# Patient Record
Sex: Female | Born: 1990 | Race: White | Hispanic: No | Marital: Single | Smoking: Former smoker
Health system: Southern US, Community
[De-identification: ages and names within clinical notes are randomized; demographics above are authoritative.]

## PROBLEM LIST (undated history)

## (undated) DIAGNOSIS — K2100 Gastro-esophageal reflux disease with esophagitis, without bleeding: Secondary | ICD-10-CM

## (undated) DIAGNOSIS — T7840XA Allergy, unspecified, initial encounter: Secondary | ICD-10-CM

## (undated) DIAGNOSIS — A599 Trichomoniasis, unspecified: Secondary | ICD-10-CM

## (undated) HISTORY — DX: Allergy, unspecified, initial encounter: T78.40XA

## (undated) HISTORY — PX: NO PAST SURGERIES: SHX2092

## (undated) HISTORY — DX: Gastro-esophageal reflux disease with esophagitis, without bleeding: K21.00

---
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
## 2010-01-14 ENCOUNTER — Inpatient Hospital Stay (HOSPITAL_COMMUNITY): Admission: AD | Admit: 2010-01-14 | Discharge: 2010-01-14 | Payer: Self-pay | Admitting: Obstetrics & Gynecology

## 2010-03-11 ENCOUNTER — Inpatient Hospital Stay (HOSPITAL_COMMUNITY): Admission: AD | Admit: 2010-03-11 | Discharge: 2010-03-11 | Payer: Self-pay | Admitting: Obstetrics and Gynecology

## 2010-03-14 ENCOUNTER — Emergency Department (HOSPITAL_COMMUNITY): Admission: EM | Admit: 2010-03-14 | Discharge: 2010-03-14 | Payer: Self-pay | Admitting: Emergency Medicine

## 2010-05-28 ENCOUNTER — Ambulatory Visit: Payer: Self-pay | Admitting: Gynecology

## 2010-05-28 ENCOUNTER — Inpatient Hospital Stay (HOSPITAL_COMMUNITY): Admission: AD | Admit: 2010-05-28 | Discharge: 2010-05-28 | Payer: Self-pay | Admitting: Family Medicine

## 2010-06-15 ENCOUNTER — Ambulatory Visit: Payer: Self-pay | Admitting: Gynecology

## 2010-06-15 ENCOUNTER — Inpatient Hospital Stay (HOSPITAL_COMMUNITY): Admission: AD | Admit: 2010-06-15 | Discharge: 2010-06-15 | Payer: Self-pay | Admitting: Obstetrics & Gynecology

## 2010-06-17 ENCOUNTER — Inpatient Hospital Stay (HOSPITAL_COMMUNITY): Admission: AD | Admit: 2010-06-17 | Discharge: 2010-06-18 | Payer: Self-pay | Admitting: Obstetrics and Gynecology

## 2010-07-03 ENCOUNTER — Ambulatory Visit: Payer: Self-pay | Admitting: Gynecology

## 2010-07-03 ENCOUNTER — Inpatient Hospital Stay (HOSPITAL_COMMUNITY): Admission: AD | Admit: 2010-07-03 | Discharge: 2010-07-03 | Payer: Self-pay | Admitting: Obstetrics and Gynecology

## 2010-07-03 DIAGNOSIS — O368190 Decreased fetal movements, unspecified trimester, not applicable or unspecified: Secondary | ICD-10-CM

## 2010-07-15 ENCOUNTER — Inpatient Hospital Stay (HOSPITAL_COMMUNITY): Admission: AD | Admit: 2010-07-15 | Discharge: 2010-07-15 | Payer: Self-pay | Admitting: Obstetrics and Gynecology

## 2010-08-06 ENCOUNTER — Inpatient Hospital Stay (HOSPITAL_COMMUNITY): Admission: AD | Admit: 2010-08-06 | Discharge: 2010-08-07 | Payer: Self-pay | Admitting: Obstetrics and Gynecology

## 2010-08-10 ENCOUNTER — Inpatient Hospital Stay (HOSPITAL_COMMUNITY): Admission: AD | Admit: 2010-08-10 | Discharge: 2010-08-10 | Payer: Self-pay | Admitting: Obstetrics and Gynecology

## 2010-08-31 ENCOUNTER — Inpatient Hospital Stay (HOSPITAL_COMMUNITY): Admission: RE | Admit: 2010-08-31 | Discharge: 2010-09-02 | Payer: Self-pay | Admitting: Obstetrics and Gynecology

## 2011-03-04 LAB — CBC
MCV: 86.7 fL (ref 78.0–100.0)
MCV: 87.5 fL (ref 78.0–100.0)
Platelets: 135 10*3/uL — ABNORMAL LOW (ref 150–400)
RBC: 3.01 MIL/uL — ABNORMAL LOW (ref 3.87–5.11)
WBC: 14.3 10*3/uL — ABNORMAL HIGH (ref 4.0–10.5)
WBC: 14.8 10*3/uL — ABNORMAL HIGH (ref 4.0–10.5)

## 2011-03-04 LAB — RPR: RPR Ser Ql: NONREACTIVE

## 2011-03-06 LAB — GC/CHLAMYDIA PROBE AMP, GENITAL: GC Probe Amp, Genital: NEGATIVE

## 2011-03-06 LAB — WET PREP, GENITAL: Trich, Wet Prep: NONE SEEN

## 2011-03-07 LAB — WET PREP, GENITAL: Yeast Wet Prep HPF POC: NONE SEEN

## 2011-03-07 LAB — URINE MICROSCOPIC-ADD ON

## 2011-03-07 LAB — URINALYSIS, ROUTINE W REFLEX MICROSCOPIC
Bilirubin Urine: NEGATIVE
Glucose, UA: NEGATIVE mg/dL
Hgb urine dipstick: NEGATIVE
Ketones, ur: NEGATIVE mg/dL
Nitrite: NEGATIVE
Protein, ur: NEGATIVE mg/dL
Urobilinogen, UA: 0.2 mg/dL (ref 0.0–1.0)
Urobilinogen, UA: 0.2 mg/dL (ref 0.0–1.0)
pH: 6 (ref 5.0–8.0)

## 2011-03-07 LAB — CBC
Hemoglobin: 12.7 g/dL (ref 12.0–15.0)
RBC: 4.29 MIL/uL (ref 3.87–5.11)

## 2011-03-07 LAB — GC/CHLAMYDIA PROBE AMP, GENITAL: GC Probe Amp, Genital: NEGATIVE

## 2011-03-07 LAB — ABO/RH: ABO/RH(D): O POS

## 2011-03-08 LAB — WET PREP, GENITAL
Clue Cells Wet Prep HPF POC: NONE SEEN
Trich, Wet Prep: NONE SEEN
Yeast Wet Prep HPF POC: NONE SEEN

## 2011-03-08 LAB — URINALYSIS, ROUTINE W REFLEX MICROSCOPIC
Nitrite: NEGATIVE
Specific Gravity, Urine: >1.03 — ABNORMAL HIGH (ref 1.005–1.030)
pH: 6 (ref 5.0–8.0)

## 2011-03-08 LAB — GC/CHLAMYDIA PROBE AMP, GENITAL: Chlamydia, DNA Probe: NEGATIVE

## 2011-03-14 LAB — URINALYSIS, ROUTINE W REFLEX MICROSCOPIC
Glucose, UA: NEGATIVE mg/dL
Hgb urine dipstick: NEGATIVE
Ketones, ur: 15 mg/dL — AB
Protein, ur: 30 mg/dL — AB
Urobilinogen, UA: 0.2 mg/dL (ref 0.0–1.0)

## 2011-03-14 LAB — URINE MICROSCOPIC-ADD ON

## 2011-03-14 LAB — HEMOGLOBIN AND HEMATOCRIT, BLOOD: Hemoglobin: 11.9 g/dL — ABNORMAL LOW (ref 12.0–15.0)

## 2011-04-06 LAB — POCT PREGNANCY, URINE: Preg Test, Ur: NEGATIVE

## 2011-05-04 NOTE — Group Therapy Note (Signed)
NAME:  HAZLEIGH, MCCLEAVE NO.:  [id number]   MEDICAL RECORD NO.:  [id number]          PATIENT TYPE:  WOC   LOCATION:  [facility]                   FACILITY:  [facility]   PHYSICIAN:  Elsie Lincoln, MD      DATE OF BIRTH:  1991-07-05   DATE OF SERVICE:  10/30/2008                                  CLINIC NOTE   The patient is a 20 year old female who presents for birth control.  She  had a recent miscarriage and had Cytotec to evacuate the uterus.  She  does not want to be pregnant and would like to birth control pills.  She  has been tested for gonorrhea, chlamydia, syphilis and AIDS, and they  are all negative.  She also a Pap smear 2 years starting sexual  intercourse with positive HPV.  However, using the ASCCP guidelines, we  will not be the doing anything for that Pap smear and we will just  repeat the Pap smear in a year, and she is now 3 years out after  intercourse.  The patient has had 3 partners.  We talked about the  insistence on condoms, and she says she will use them in addition to her  birth control.  We talked about all the different methods, and she would  like the birth control pill.  We gave her prescription for Loestrin FE  24.  Explained in detail how to use them.   PAST MEDICAL HISTORY:  Asthma.   PAST SURGICAL HISTORY:  None.   MEDICATIONS:  Albuterol.   ALLERGIES:  None.   FAMILY HISTORY:  No history of blood clots in legs or lungs.   ASSESSMENT/PLAN:  A 20 year old female for oral contraceptive pill  start.  The patient has blood pressure 134/86.  The patient will need to  come back in 47-month to check blood pressure and make sure she is not  overtly hypertensive.  At this point, we should discuss some weight loss  as weight 196.9.           ______________________________  Elsie Lincoln, MD     KL/MEDQ  D:  10/30/2008  T:  10/30/2008  Job:  [id number]

## 2011-07-29 ENCOUNTER — Encounter (HOSPITAL_COMMUNITY): Payer: Self-pay

## 2011-07-29 ENCOUNTER — Inpatient Hospital Stay (HOSPITAL_COMMUNITY)
Admission: AD | Admit: 2011-07-29 | Discharge: 2011-07-29 | Disposition: A | Discharge status: Home / Self Care | Payer: Self-pay | Source: Ambulatory Visit | Attending: Obstetrics & Gynecology | Admitting: Obstetrics & Gynecology

## 2011-07-29 DIAGNOSIS — A5901 Trichomonal vulvovaginitis: Secondary | ICD-10-CM | POA: Insufficient documentation

## 2011-07-29 DIAGNOSIS — A599 Trichomoniasis, unspecified: Secondary | ICD-10-CM

## 2011-07-29 DIAGNOSIS — N949 Unspecified condition associated with female genital organs and menstrual cycle: Secondary | ICD-10-CM | POA: Insufficient documentation

## 2011-07-29 LAB — WET PREP, GENITAL: Yeast Wet Prep HPF POC: NONE SEEN

## 2011-07-29 MED ORDER — METRONIDAZOLE 500 MG PO TABS: 500.0000 mg | ORAL_TABLET | Freq: Two times a day (BID) | ORAL | Status: AC

## 2011-07-29 NOTE — Progress Notes (Signed)
Pt states she has been having vaginal itching and irritation for about one week.

## 2011-07-29 NOTE — Progress Notes (Signed)
Has been on menstrual cycle x1week, itching in vaginal area began after that. Denies vaginal d/c changes noted. LMP 07/22/2011.

## 2011-07-29 NOTE — ED Provider Notes (Addendum)
History   Pt presents today c/o vag irritation and itching that began about 2 days ago. She states she can also see "some red places" on her vagina. She denies fever, dysuria, or any other problems at this time.  Chief Complaint  Patient presents with  . Vaginal Itching   HPI  OB History    Grav Para Term Preterm Abortions TAB SAB Ect Mult Living   2 1 1  1  1   1       No past medical history on file.  No past surgical history on file.  No family history on file.  History  Substance Use Topics  . Smoking status: Not on file  . Smokeless tobacco: Not on file  . Alcohol Use: Not on file    Allergies: Allergies not on file  No prescriptions prior to admission    Review of Systems  Constitutional: Negative for fever and chills.  Gastrointestinal: Negative for nausea, vomiting, abdominal pain, diarrhea and constipation.  Genitourinary: Negative for dysuria, urgency, frequency and hematuria.  Skin: Negative for rash.  Neurological: Negative for dizziness and headaches.  Psychiatric/Behavioral: Negative for depression and suicidal ideas.   Physical Exam   Blood pressure 119/73, pulse 64, temperature 98.3 F (36.8 C), temperature source Oral, resp. rate 16, height 5' 3.5" (1.613 m), weight 209 lb 3.2 oz (94.892 kg), last menstrual period 07/22/2011, SpO2 100.00%.  Physical Exam  Constitutional: She is oriented to person, place, and time. She appears well-developed and well-nourished. No distress.  HENT:  Head: Normocephalic and atraumatic.  Eyes: EOM are normal. Pupils are equal, round, and reactive to light.  GI: Soft. She exhibits no distension. There is no tenderness. There is no rebound and no guarding.  Genitourinary:    There is tenderness and bleeding around the vagina. Vaginal discharge found.  Neurological: She is alert and oriented to person, place, and time.  Skin: Skin is warm and dry. She is not diaphoretic.  Psychiatric: She has a normal mood and  affect. Her behavior is normal. Judgment and thought content normal.    MAU Course  Procedures  Results for orders placed during the hospital encounter of 07/29/11 (from the past 24 hour(s))  WET PREP, GENITAL     Status: Abnormal   Collection Time   07/29/11 10:07 AM      Component Value Range   Yeast, Wet Prep NONE SEEN  NONE SEEN    Trich, Wet Prep MODERATE (*) NONE SEEN    Clue Cells, Wet Prep FEW (*) NONE SEEN    WBC, Wet Prep HPF POC MANY (*) NONE SEEN      Assessment and Plan  Trichamoniasis: discussed with pt at length. Will tx with Flagyl. Warned of antabuse reaction. Discussed safe sex practices. Discussed diet, activity, risks, and precautions.  Clinton Gallant. Rice III, DrHSc, MPAS, PA-C  07/29/2011, 10:00 AM   Henrietta Hoover, PA 07/29/11 1024

## 2011-07-30 LAB — GC/CHLAMYDIA PROBE AMP, GENITAL: GC Probe Amp, Genital: NEGATIVE

## 2011-07-31 LAB — HERPES SIMPLEX VIRUS CULTURE

## 2011-09-24 LAB — POCT RAPID STREP A: Streptococcus, Group A Screen (Direct): NEGATIVE

## 2011-12-21 ENCOUNTER — Encounter (HOSPITAL_COMMUNITY): Payer: Self-pay

## 2011-12-21 ENCOUNTER — Inpatient Hospital Stay (HOSPITAL_COMMUNITY)
Admission: AD | Admit: 2011-12-21 | Discharge: 2011-12-21 | Disposition: A | Discharge status: Home / Self Care | Payer: Self-pay | Source: Ambulatory Visit | Attending: Obstetrics & Gynecology | Admitting: Obstetrics & Gynecology

## 2011-12-21 DIAGNOSIS — L293 Anogenital pruritus, unspecified: Secondary | ICD-10-CM | POA: Insufficient documentation

## 2011-12-21 DIAGNOSIS — B373 Candidiasis of vulva and vagina: Secondary | ICD-10-CM

## 2011-12-21 DIAGNOSIS — B3731 Acute candidiasis of vulva and vagina: Secondary | ICD-10-CM | POA: Insufficient documentation

## 2011-12-21 HISTORY — DX: Trichomoniasis, unspecified: A59.9

## 2011-12-21 LAB — WET PREP, GENITAL: Trich, Wet Prep: NONE SEEN

## 2011-12-21 MED ORDER — FLUCONAZOLE 150 MG PO TABS
150.0000 mg | ORAL_TABLET | Freq: Once | ORAL | Status: AC
  Administered 2011-12-21: 150 mg via ORAL
  Filled 2011-12-21: qty 1

## 2011-12-21 NOTE — Progress Notes (Signed)
Patient states she has had vaginal itching since yesterday. No pain, bleeding or discharge.

## 2011-12-21 NOTE — ED Provider Notes (Signed)
History     CSN: [id number]  Arrival date & time 12/21/11  1031   None     Chief Complaint  Patient presents with  . Vaginal Itching    HPI Becky Scott is a 21 y.o. female who presents to [facility]U for vaginal discharge and itching. States she has had similar symptoms in the past when she had trichomonas. Current sex partner x 3 years and was with him when she had trich before. Last pap smear one year ago and was normal. Denies any other problems today. The history was provided by the patient.  Past Medical History  Diagnosis Date  . No pertinent past medical history   . Trichomonas   . Asthma     Past Surgical History  Procedure Date  . No past surgeries     History reviewed. No pertinent family history.  History  Substance Use Topics  . Smoking status: Never Smoker   . Smokeless tobacco: Never Used  . Alcohol Use: No    OB History    Grav Para Term Preterm Abortions TAB SAB Ect Mult Living   2 1 1  1  1   1       Review of Systems  Constitutional: Negative for fever, chills, diaphoresis and fatigue.  HENT: Negative for ear pain, congestion, sore throat, facial swelling, neck pain, neck stiffness, dental problem and sinus pressure.   Eyes: Negative for photophobia, pain and discharge.  Respiratory: Negative for cough, chest tightness and wheezing.   Gastrointestinal: Negative for nausea, vomiting, abdominal pain, diarrhea, constipation and abdominal distention.  Genitourinary: Positive for vaginal discharge. Negative for dysuria, frequency, flank pain and difficulty urinating.  Musculoskeletal: Negative for myalgias, back pain and gait problem.  Skin: Negative for color change and rash.  Neurological: Negative for dizziness, speech difficulty, weakness, light-headedness, numbness and headaches.  Psychiatric/Behavioral: Negative for confusion and agitation.    Allergies  Review of patient's allergies indicates no known allergies.  Home Medications  No current  outpatient prescriptions on file.  BP 117/71  Pulse 100  Temp(Src) 99.1 F (37.3 C) (Oral)  Resp 20  Ht 5' 4.5" (1.638 m)  Wt 205 lb (92.987 kg)  BMI 34.64 kg/m2  SpO2 98%  LMP 09/20/2011  Physical Exam  Nursing note and vitals reviewed. Constitutional: She is oriented to person, place, and time. She appears well-developed and well-nourished. No distress.  HENT:  Head: Normocephalic.  Eyes: EOM are normal.  Neck: Neck supple.  Cardiovascular: Normal rate.   Pulmonary/Chest: Effort normal.  Abdominal: Soft. There is no tenderness.  Genitourinary:       External genitalia with mild erythema and irritation. No lesions noted. Thick white discharge vaginal vault. No CMT, no adnexal tenderness or mass palpated. Uterus without palpable enlargement.  Musculoskeletal: Normal range of motion.  Neurological: She is alert and oriented to person, place, and time. No cranial nerve deficit.  Skin: Skin is warm and dry.  Psychiatric: She has a normal mood and affect. Her behavior is normal. Judgment and thought content normal.   Results for orders placed during the hospital encounter of 12/21/11 (from the past 24 hour(s))  WET PREP, GENITAL     Status: Abnormal   Collection Time   12/21/11 11:30 AM      Component Value Range   Yeast, Wet Prep RARE (*) NONE SEEN    Trich, Wet Prep NONE SEEN  NONE SEEN    Clue Cells, Wet Prep NONE SEEN  NONE SEEN  WBC, Wet Prep HPF POC FEW (*) NONE SEEN    Assessment: Yeast infection  Plan:  Diflucan 150 mg po   OTC yeast cream   GC, Chlamydia cultures pending   Follow up with [facility] ED Course  Procedures  MDM          [facility], NP 12/21/11 1208

## 2011-12-21 NOTE — Progress Notes (Signed)
Patient is here with c/o intense vaginal itching that started yesterday. Denies any vaginal bleeding

## 2012-04-04 ENCOUNTER — Encounter (HOSPITAL_COMMUNITY): Payer: Self-pay

## 2012-04-04 ENCOUNTER — Emergency Department (HOSPITAL_COMMUNITY)
Admission: EM | Admit: 2012-04-04 | Discharge: 2012-04-04 | Disposition: A | Discharge status: Home / Self Care | Payer: Self-pay | Attending: Emergency Medicine | Admitting: Emergency Medicine

## 2012-04-04 DIAGNOSIS — J45909 Unspecified asthma, uncomplicated: Secondary | ICD-10-CM | POA: Insufficient documentation

## 2012-04-04 MED ORDER — ALBUTEROL SULFATE (5 MG/ML) 0.5% IN NEBU
INHALATION_SOLUTION | RESPIRATORY_TRACT | Status: AC
  Administered 2012-04-04: 5 mg via RESPIRATORY_TRACT
  Filled 2012-04-04: qty 1

## 2012-04-04 MED ORDER — PREDNISONE 20 MG PO TABS
60.0000 mg | ORAL_TABLET | Freq: Once | ORAL | Status: AC
  Administered 2012-04-04: 60 mg via ORAL
  Filled 2012-04-04: qty 3

## 2012-04-04 MED ORDER — ALBUTEROL SULFATE (5 MG/ML) 0.5% IN NEBU
5.0000 mg | INHALATION_SOLUTION | Freq: Once | RESPIRATORY_TRACT | Status: AC
  Administered 2012-04-04: 5 mg via RESPIRATORY_TRACT

## 2012-04-04 MED ORDER — PREDNISONE (PAK) 10 MG PO TABS: 10.0000 mg | ORAL_TABLET | Freq: Every day | ORAL | Status: AC

## 2012-04-04 MED ORDER — ALBUTEROL SULFATE HFA 108 (90 BASE) MCG/ACT IN AERS
2.0000 | INHALATION_SPRAY | RESPIRATORY_TRACT | Status: DC | PRN
  Administered 2012-04-04: 2 via RESPIRATORY_TRACT
  Filled 2012-04-04: qty 6.7

## 2012-04-04 NOTE — ED Provider Notes (Signed)
Medical screening examination/treatment/procedure(s) were performed by non-physician practitioner and as supervising physician I was immediately available for consultation/collaboration.  Dierra Riesgo M Curren Mohrmann, MD 04/04/12 0802 

## 2012-04-04 NOTE — ED Notes (Signed)
sts out of her inhaler and her asthma is bothering her for 1 week now. Secondary due to allergies

## 2012-04-04 NOTE — Discharge Instructions (Signed)
Asthma Attack Prevention HOW CAN ASTHMA BE PREVENTED? Currently, there is no way to prevent asthma from starting. However, you can take steps to control the disease and prevent its symptoms after you have been diagnosed. Learn about your asthma and how to control it. Take an active role to control your asthma by working with your caregiver to create and follow an asthma action plan. An asthma action plan guides you in taking your medicines properly, avoiding factors that make your asthma worse, tracking your level of asthma control, responding to worsening asthma, and seeking emergency care when needed. To track your asthma, keep records of your symptoms, check your peak flow number using a peak flow meter (handheld device that shows how well air moves out of your lungs), and get regular asthma checkups.  Other ways to prevent asthma attacks include:  Use medicines as your caregiver directs.   Identify and avoid things that make your asthma worse (as much as you can).   Keep track of your asthma symptoms and level of control.   Get regular checkups for your asthma.   With your caregiver, write a detailed plan for taking medicines and managing an asthma attack. Then be sure to follow your action plan. Asthma is an ongoing condition that needs regular monitoring and treatment.   Identify and avoid asthma triggers. A number of outdoor allergens and irritants (pollen, mold, cold air, air pollution) can trigger asthma attacks. Find out what causes or makes your asthma worse, and take steps to avoid those triggers (see below).   Monitor your breathing. Learn to recognize warning signs of an attack, such as slight coughing, wheezing or shortness of breath. However, your lung function may already decrease before you notice any signs or symptoms, so regularly measure and record your peak airflow with a home peak flow meter.   Identify and treat attacks early. If you act quickly, you're less likely to have  a severe attack. You will also need less medicine to control your symptoms. When your peak flow measurements decrease and alert you to an upcoming attack, take your medicine as instructed, and immediately stop any activity that may have triggered the attack. If your symptoms do not improve, get medical help.   Pay attention to increasing quick-relief inhaler use. If you find yourself relying on your quick-relief inhaler (such as albuterol), your asthma is not under control. See your caregiver about adjusting your treatment.  IDENTIFY AND CONTROL FACTORS THAT MAKE YOUR ASTHMA WORSE A number of common things can set off or make your asthma symptoms worse (asthma triggers). Keep track of your asthma symptoms for several weeks, detailing all the environmental and emotional factors that are linked with your asthma. When you have an asthma attack, go back to your asthma diary to see which factor, or combination of factors, might have contributed to it. Once you know what these factors are, you can take steps to control many of them.  Allergies: If you have allergies and asthma, it is important to take asthma prevention steps at home. Asthma attacks (worsening of asthma symptoms) can be triggered by allergies, which can cause temporary increased inflammation of your airways. Minimizing contact with the substance to which you are allergic will help prevent an asthma attack. Animal Dander:   Some people are allergic to the flakes of skin or dried saliva from animals with fur or feathers. Keep these pets out of your home.   If you can't keep a pet outdoors, keep the   pet out of your bedroom and other sleeping areas at all times, and keep the door closed.   Remove carpets and furniture covered with cloth from your home. If that is not possible, keep the pet away from fabric-covered furniture and carpets.  Dust Mites:  Many people with asthma are allergic to dust mites. Dust mites are tiny bugs that are found in  every home, in mattresses, pillows, carpets, fabric-covered furniture, bedcovers, clothes, stuffed toys, fabric, and other fabric-covered items.   Cover your mattress in a special dust-proof cover.   Cover your pillow in a special dust-proof cover, or wash the pillow each week in hot water. Water must be hotter than 130 F to kill dust mites. Cold or warm water used with detergent and bleach can also be effective.   Wash the sheets and blankets on your bed each week in hot water.   Try not to sleep or lie on cloth-covered cushions.   Call ahead when traveling and ask for a smoke-free hotel room. Bring your own bedding and pillows, in case the hotel only supplies feather pillows and down comforters, which may contain dust mites and cause asthma symptoms.   Remove carpets from your bedroom and those laid on concrete, if you can.   Keep stuffed toys out of the bed, or wash the toys weekly in hot water or cooler water with detergent and bleach.  Cockroaches:  Many people with asthma are allergic to the droppings and remains of cockroaches.   Keep food and garbage in closed containers. Never leave food out.   Use poison baits, traps, powders, gels, or paste (for example, boric acid).   If a spray is used to kill cockroaches, stay out of the room until the odor goes away.  Indoor Mold:  Fix leaky faucets, pipes, or other sources of water that have mold around them.   Clean moldy surfaces with a cleaner that has bleach in it.  Pollen and Outdoor Mold:  When pollen or mold spore counts are high, try to keep your windows closed.   Stay indoors with windows closed from late morning to afternoon, if you can. Pollen and some mold spore counts are highest at that time.   Ask your caregiver whether you need to take or increase anti-inflammatory medicine before your allergy season starts.  Irritants:   Tobacco smoke is an irritant. If you smoke, ask your caregiver how you can quit. Ask family  members to quit smoking, too. Do not allow smoking in your home or car.   If possible, do not use a wood-burning stove, kerosene heater, or fireplace. Minimize exposure to all sources of smoke, including incense, candles, fires, and fireworks.   Try to stay away from strong odors and sprays, such as perfume, talcum powder, hair spray, and paints.   Decrease humidity in your home and use an indoor air cleaning device. Reduce indoor humidity to below 60 percent. Dehumidifiers or central air conditioners can do this.   Try to have someone else vacuum for you once or twice a week, if you can. Stay out of rooms while they are being vacuumed and for a short while afterward.   If you vacuum, use a dust mask from a hardware store, a double-layered or microfilter vacuum cleaner bag, or a vacuum cleaner with a HEPA filter.   Sulfites in foods and beverages can be irritants. Do not drink beer or wine, or eat dried fruit, processed potatoes, or shrimp if they cause asthma   symptoms.   Cold air can trigger an asthma attack. Cover your nose and mouth with a scarf on cold or windy days.   Several health conditions can make asthma more difficult to manage, including runny nose, sinus infections, reflux disease, psychological stress, and sleep apnea. Your caregiver will treat these conditions, as well.   Avoid close contact with people who have a cold or the flu, since your asthma symptoms may get worse if you catch the infection from them. Wash your hands thoroughly after touching items that may have been handled by people with a respiratory infection.   Get a flu shot every year to protect against the flu virus, which often makes asthma worse for days or weeks. Also get a pneumonia shot once every five to 10 years.  Drugs:  Aspirin and other painkillers can cause asthma attacks. 10% to 20% of people with asthma have sensitivity to aspirin or a group of painkillers called non-steroidal anti-inflammatory drugs  (NSAIDS), such as ibuprofen and naproxen. These drugs are used to treat pain and reduce fevers. Asthma attacks caused by any of these medicines can be severe and even fatal. These drugs must be avoided in people who have known aspirin sensitive asthma. Products with acetaminophen are considered safe for people who have asthma. It is important that people with aspirin sensitivity read labels of all over-the-counter drugs used to treat pain, colds, coughs, and fever.   Beta blockers and ACE inhibitors are other drugs which you should discuss with your caregiver, in relation to your asthma.  ALLERGY SKIN TESTING  Ask your asthma caregiver about allergy skin testing or blood testing (RAST test) to identify the allergens to which you are sensitive. If you are found to have allergies, allergy shots (immunotherapy) for asthma may help prevent future allergies and asthma. With allergy shots, small doses of allergens (substances to which you are allergic) are injected under your skin on a regular schedule. Over a period of time, your body may become used to the allergen and less responsive with asthma symptoms. You can also take measures to minimize your exposure to those allergens. EXERCISE  If you have exercise-induced asthma, or are planning vigorous exercise, or exercise in cold, humid, or dry environments, prevent exercise-induced asthma by following your caregiver's advice regarding asthma treatment before exercising. Document Released: 11/24/2009 Document Revised: 11/25/2011 Document Reviewed: 11/24/2009 [employer] Patient Information 2012 [employer]. 

## 2012-04-04 NOTE — ED Provider Notes (Signed)
History     CSN: [id number]  Arrival date & time 04/04/12  5621   First MD Initiated Contact with Patient 04/04/12 [phone]      Chief Complaint  Patient presents with  . Asthma    (Consider location/radiation/quality/duration/timing/severity/associated sxs/prior treatment) HPI  21 year old female with history of presents with a chief complaint of shortness of breath. Patient states she has ran out of her usual albuterol inhaler. Past week she's had intermittent bouts of increased shortness breath especially when trying to sleep. Sts during the day she feels much better. Patient believes that her asthma is aggravated by seasonal allergy. She is currently taking Claritin.  She denies fever, chills, sore throat, sneezing, chest pain, nausea, vomiting, dear diarrhea, abdominal pain. Patient has nonproductive cough and this is usual.  Patient requests for an inhaler.    Past Medical History  Diagnosis Date  . No pertinent past medical history   . Trichomonas   . Asthma     Past Surgical History  Procedure Date  . No past surgeries     History reviewed. No pertinent family history.  History  Substance Use Topics  . Smoking status: Never Smoker   . Smokeless tobacco: Never Used  . Alcohol Use: No    OB History    Grav Para Term Preterm Abortions TAB SAB Ect Mult Living   2 1 1  1  1   1       Review of Systems  All other systems reviewed and are negative.    Allergies  Review of patient's allergies indicates no known allergies.  Home Medications   Current Outpatient Rx  Name Route Sig Dispense Refill  . LORATADINE 10 MG PO TABS Oral Take 10 mg by mouth daily.      BP 129/64  Pulse 79  Temp(Src) 97.6 F (36.4 C) (Oral)  Resp 16  SpO2 97%  Physical Exam  Nursing note and vitals reviewed. Constitutional: She is oriented to person, place, and time. She appears well-developed and well-nourished. No distress.  HENT:  Head: Atraumatic.  Right Ear: External ear  normal.  Left Ear: External ear normal.  Mouth/Throat: Oropharynx is clear and moist. No oropharyngeal exudate.  Eyes: Conjunctivae are normal.  Neck: Neck supple.  Cardiovascular: Normal rate and regular rhythm.   Pulmonary/Chest: Effort normal and breath sounds normal. No respiratory distress. She has no wheezes. She has no rales. She exhibits no tenderness.  Abdominal: Soft.  Musculoskeletal: Normal range of motion.  Lymphadenopathy:    She has no cervical adenopathy.  Neurological: She is alert and oriented to person, place, and time.  Skin: Skin is warm.    ED Course  Procedures (including critical care time)  Labs Reviewed - No data to display No results found.   No diagnosis found.    MDM  Patient has a history of asthma. She is PERC negative.  Her lung exam is unremarkable.  Pt sts she feels much better after albuterol nebs.  Will give prednisone and an inhaler at discharge.  Pt currently in no acute resp distress. No prior hx of admission or intubation.  Safe to be discharge.          Fayrene Helper, PA-C 04/04/12 [phone]

## 2012-05-16 ENCOUNTER — Emergency Department (HOSPITAL_COMMUNITY)
Admission: EM | Admit: 2012-05-16 | Discharge: 2012-05-16 | Disposition: A | Discharge status: Home / Self Care | Payer: Self-pay | Attending: Emergency Medicine | Admitting: Emergency Medicine

## 2012-05-16 ENCOUNTER — Encounter (HOSPITAL_COMMUNITY): Payer: Self-pay | Admitting: Emergency Medicine

## 2012-05-16 DIAGNOSIS — Z760 Encounter for issue of repeat prescription: Secondary | ICD-10-CM | POA: Insufficient documentation

## 2012-05-16 DIAGNOSIS — J45909 Unspecified asthma, uncomplicated: Secondary | ICD-10-CM | POA: Insufficient documentation

## 2012-05-16 MED ORDER — ALBUTEROL SULFATE HFA 108 (90 BASE) MCG/ACT IN AERS
2.0000 | INHALATION_SPRAY | RESPIRATORY_TRACT | Status: DC | PRN
  Administered 2012-05-16: 2 via RESPIRATORY_TRACT
  Filled 2012-05-16: qty 6.7

## 2012-05-16 MED ORDER — ALBUTEROL SULFATE HFA 108 (90 BASE) MCG/ACT IN AERS: 1.0000 | INHALATION_SPRAY | Freq: Four times a day (QID) | RESPIRATORY_TRACT | Status: DC | PRN

## 2012-05-16 NOTE — ED Provider Notes (Signed)
History     CSN: [id number]  Arrival date & time 05/16/12  1510   First MD Initiated Contact with Patient 05/16/12 1756      Chief Complaint  Patient presents with  . Asthma    (Consider location/radiation/quality/duration/timing/severity/associated sxs/prior treatment) Patient is a 21 y.o. female presenting with asthma. The history is provided by the patient. No language interpreter was used.  Asthma This is a recurrent problem. The current episode started more than 1 month ago. The problem occurs daily. The problem has been unchanged. Pertinent negatives include no chest pain or fever. The treatment provided significant relief.  Patient presents to ED today for refill of albuterol inhaler.  Patient with history of asthma, ran out of inhaler two day ago.  Did not receive a prescription for refills.  Patient provided with referral information on last visit, but has not yet followed-up.  Patient in no acute distress.  Respirations even and non-labored. Patient requesting refill of inhaler.  Past Medical History  Diagnosis Date  . No pertinent past medical history   . Trichomonas   . Asthma     Past Surgical History  Procedure Date  . No past surgeries     No family history on file.  History  Substance Use Topics  . Smoking status: Never Smoker   . Smokeless tobacco: Never Used  . Alcohol Use: No    OB History    Grav Para Term Preterm Abortions TAB SAB Ect Mult Living   2 1 1  1  1   1       Review of Systems  Constitutional: Negative for fever.  Cardiovascular: Negative for chest pain.  All other systems reviewed and are negative.    Allergies  Review of patient's allergies indicates no known allergies.  Home Medications   Current Outpatient Rx  Name Route Sig Dispense Refill  . ALBUTEROL SULFATE HFA 108 (90 BASE) MCG/ACT IN AERS Inhalation Inhale 2 puffs into the lungs every 6 (six) hours as needed. For congestion      BP 128/86  Pulse 56  Temp(Src)  98.6 F (37 C) (Oral)  Resp 16  SpO2 100%  Physical Exam  Nursing note and vitals reviewed. Constitutional: She is oriented to person, place, and time. She appears well-developed and well-nourished.  HENT:  Head: Normocephalic.  Eyes: Pupils are equal, round, and reactive to light.  Neck: Normal range of motion. Neck supple.  Cardiovascular: Normal rate, regular rhythm, normal heart sounds and intact distal pulses.   Pulmonary/Chest: Effort normal. No respiratory distress. She has no wheezes.  Abdominal: Soft. Bowel sounds are normal.  Musculoskeletal: Normal range of motion.  Neurological: She is alert and oriented to person, place, and time.  Skin: Skin is warm and dry.  Psychiatric: She has a normal mood and affect. Her behavior is normal. Judgment and thought content normal.    ED Course  Procedures (including critical care time)  Labs Reviewed - No data to display No results found.   No diagnosis found.  Medication refill  MDM          Jimmye Norman, NP 05/17/12 0006

## 2012-05-16 NOTE — Discharge Instructions (Signed)
Medication Refill, Emergency Department We have refilled your medication today as a courtesy to you. It is best for your medical care, however, to take care of getting refills done through your primary caregiver's office. They have your records and can do a better job of follow-up than we can in the emergency department. On maintenance medications, we often only prescribe enough medications to get you by until you are able to see your regular caregiver. This is a more expensive way to refill medications. In the future, please plan for refills so that you will not have to use the emergency department for this. Thank you for your help. Your help allows us to better take care of the daily emergencies that enter our department. Document Released: 03/24/2004 Document Revised: 11/25/2011 Document Reviewed: 12/06/2005 ExitCare Patient Information 2012 [employer]  RESOURCE GUIDE  Dental Problems  Patients with Medicaid: [facility]                     [facility] [address]                                           [address] Phone:  [phone]                                                  Phone:  [phone]  If unable to pay or uninsured, contact:  [facility] or [facility] to become qualified for the adult dental clinic.  Chronic Pain Problems Contact [facility]  [phone] Patients need to be referred by their primary care doctor.  Insufficient Money for Medicine Contact [employer]:  call "211" or [facility] 271-5999.  No Primary Care Doctor Call [facility]  832-8000 Other agencies that provide inexpensive medical care    [facility]  832-8035    [facility]  832-7272    [facility]  [phone]    [facility]  832-4777    [facility]  [phone]    [facility]  [phone]  Psychological Services [facility]   [phone] [facility]  [phone] [facility]   800 853-5163 (emergency services 641-4993)  Substance Abuse Resources [facility]  336-882-2125 [facility] 336-784-9470 [facility] 336-285-9073 [facility] 336-845-3988 Residential & Outpatient Substance Abuse Program  800-659-3381  Abuse/Neglect [place] Child Abuse Hotline (336) 641-3795 [place] [facility] Abuse Hotline 800-378-5315 (After Hours)  Emergency Shelter [facility] (336) 271-5985  [facility] (336) 275-9566 [facility] (704) 372-4663  MRSA Hotline #:   832-7006    [place] [facility]  [facility] [place]     [employer]                          [facility] [address] [place]                       [address]      [address]  [place]                                                  [place]                            [place] Phone:  [phone]                                   Phone:  [phone]                 Phone:  [phone]  [facility] Phone:  [phone]  [place] Child Abuse Hotline (336) 342-1394 (336) 342-3537 (After Hours)   

## 2012-05-16 NOTE — ED Notes (Signed)
Patient states history of asthma and needs a medication refill for her inhaler. Denies shortness of breath at this time.

## 2012-05-18 NOTE — ED Provider Notes (Signed)
Medical screening examination/treatment/procedure(s) were performed by non-physician practitioner and as supervising physician I was immediately available for consultation/collaboration.   Suzi Roots, MD 05/18/12 1023

## 2012-09-20 ENCOUNTER — Encounter (HOSPITAL_COMMUNITY): Payer: Self-pay | Admitting: *Deleted

## 2012-09-20 ENCOUNTER — Inpatient Hospital Stay (HOSPITAL_COMMUNITY)
Admission: AD | Admit: 2012-09-20 | Discharge: 2012-09-20 | Disposition: A | Discharge status: Home / Self Care | Payer: Self-pay | Source: Ambulatory Visit | Attending: Obstetrics & Gynecology | Admitting: Obstetrics & Gynecology

## 2012-09-20 DIAGNOSIS — Z3202 Encounter for pregnancy test, result negative: Secondary | ICD-10-CM | POA: Insufficient documentation

## 2012-09-20 LAB — POCT PREGNANCY, URINE: Preg Test, Ur: NEGATIVE

## 2012-09-20 NOTE — MAU Note (Signed)
Stomach has been feeling weird like it did when she was pregnant before.  Has implanon, was placed 09/2010

## 2012-09-20 NOTE — MAU Provider Note (Signed)
Becky Scott is a 21 y.o. female who presents to MAU for pregnancy test. She states she uses Implanon for birth control but just wanted to be sure she was not pregnant.   BP 140/83  Pulse 86  Temp 98.1 F (36.7 C) (Oral)  Resp 18  Ht 5\' 4"  (1.626 m)  Wt 203 lb (92.08 kg)  BMI 34.84 kg/m2   Assessment: Negative pregnancy test  Plan:  Discussed results with the patient.   D/C home

## 2012-11-25 ENCOUNTER — Emergency Department (HOSPITAL_COMMUNITY): Payer: Self-pay

## 2012-11-25 ENCOUNTER — Emergency Department (HOSPITAL_COMMUNITY)
Admission: EM | Admit: 2012-11-25 | Discharge: 2012-11-25 | Disposition: A | Discharge status: Home / Self Care | Payer: Self-pay | Attending: Emergency Medicine | Admitting: Emergency Medicine

## 2012-11-25 ENCOUNTER — Encounter (HOSPITAL_COMMUNITY): Payer: Self-pay | Admitting: *Deleted

## 2012-11-25 DIAGNOSIS — R509 Fever, unspecified: Secondary | ICD-10-CM | POA: Insufficient documentation

## 2012-11-25 DIAGNOSIS — A599 Trichomoniasis, unspecified: Secondary | ICD-10-CM | POA: Insufficient documentation

## 2012-11-25 DIAGNOSIS — B9789 Other viral agents as the cause of diseases classified elsewhere: Secondary | ICD-10-CM | POA: Insufficient documentation

## 2012-11-25 DIAGNOSIS — B349 Viral infection, unspecified: Secondary | ICD-10-CM

## 2012-11-25 DIAGNOSIS — R51 Headache: Secondary | ICD-10-CM | POA: Insufficient documentation

## 2012-11-25 DIAGNOSIS — J3489 Other specified disorders of nose and nasal sinuses: Secondary | ICD-10-CM | POA: Insufficient documentation

## 2012-11-25 DIAGNOSIS — Z87891 Personal history of nicotine dependence: Secondary | ICD-10-CM | POA: Insufficient documentation

## 2012-11-25 DIAGNOSIS — J45909 Unspecified asthma, uncomplicated: Secondary | ICD-10-CM | POA: Insufficient documentation

## 2012-11-25 DIAGNOSIS — Z79899 Other long term (current) drug therapy: Secondary | ICD-10-CM | POA: Insufficient documentation

## 2012-11-25 DIAGNOSIS — R112 Nausea with vomiting, unspecified: Secondary | ICD-10-CM | POA: Insufficient documentation

## 2012-11-25 DIAGNOSIS — IMO0001 Reserved for inherently not codable concepts without codable children: Secondary | ICD-10-CM | POA: Insufficient documentation

## 2012-11-25 MED ORDER — HYDROCODONE-ACETAMINOPHEN 7.5-500 MG/15ML PO SOLN: 10.0000 mL | Freq: Four times a day (QID) | ORAL | Status: DC | PRN

## 2012-11-25 NOTE — ED Notes (Signed)
Pt reports having fever/chills, cough and headache x 4 days. No relief with otc meds.

## 2012-11-25 NOTE — ED Provider Notes (Signed)
Medical screening examination/treatment/procedure(s) were performed by non-physician practitioner and as supervising physician I was immediately available for consultation/collaboration.   Gavin Pound. Zyra Parrillo, MD 11/25/12 1148

## 2012-11-25 NOTE — ED Provider Notes (Signed)
History     CSN: [id number]  Arrival date & time 11/25/12  0909   First MD Initiated Contact with Patient 11/25/12 [phone]      Chief Complaint  Patient presents with  . URI    (Consider location/radiation/quality/duration/timing/severity/associated sxs/prior treatment) HPI  Becky Scott is a 21 y.o. female complaining of dry cough, fever, HA, rhinorrhea, myalgia, intermittent nonbloody nonbilious emesis for 4 days. Patient reports she has a sick contact, her son has had a chest cold. She reports a positional cough and shortness of breath it is worse when she is supine. She denies chest pain, change in bowel or bladder habits. Fever is most bothersome symptom to her. She's been taking Tylenol Cold and flu and Motrin with little relief.  Past Medical History  Diagnosis Date  . No pertinent past medical history   . Trichomonas   . Asthma     Past Surgical History  Procedure Date  . No past surgeries     Family History  Problem Relation Age of Onset  . Other Neg Hx     History  Substance Use Topics  . Smoking status: Former Smoker    Types: Cigarettes  . Smokeless tobacco: Never Used     Comment: 2011  . Alcohol Use: No    OB History    Grav Para Term Preterm Abortions TAB SAB Ect Mult Living   2 1 1  1  1   1       Review of Systems  Constitutional: Positive for fever.  HENT: Positive for rhinorrhea.   Respiratory: Negative for shortness of breath.   Cardiovascular: Negative for chest pain.  Gastrointestinal: Positive for nausea and vomiting. Negative for abdominal pain and diarrhea.  Musculoskeletal: Positive for myalgias.  Neurological: Positive for headaches.  All other systems reviewed and are negative.    Allergies  Review of patient's allergies indicates no known allergies.  Home Medications   Current Outpatient Rx  Name  Route  Sig  Dispense  Refill  . ALBUTEROL SULFATE HFA 108 (90 BASE) MCG/ACT IN AERS   Inhalation   Inhale 2 puffs into the  lungs every 6 (six) hours as needed. For shortness of breath         . MUCINEX PO   Oral   Take 30 mLs by mouth every 4 (four) hours as needed. For congestion         . IBUPROFEN 200 MG PO TABS   Oral   Take 400 mg by mouth every 6 (six) hours as needed. For pain         . NYQUIL PO   Oral   Take 30 mLs by mouth at bedtime as needed. For cold symptoms           BP 126/77  Pulse 99  Temp 99.3 F (37.4 C) (Oral)  Resp 18  SpO2 97%  Physical Exam  Nursing note and vitals reviewed. Constitutional: She is oriented to person, place, and time. She appears well-developed and well-nourished. No distress.  HENT:  Head: Normocephalic.  Mouth/Throat: Oropharynx is clear and moist.  Eyes: Conjunctivae normal and EOM are normal. Pupils are equal, round, and reactive to light.  Neck: Normal range of motion.  Cardiovascular: Normal rate, regular rhythm and normal heart sounds.   Pulmonary/Chest: Effort normal and breath sounds normal. No stridor. No respiratory distress. She has no wheezes. She has no rales. She exhibits no tenderness.  Abdominal: Soft. Bowel sounds are normal.  Musculoskeletal: Normal  range of motion.  Lymphadenopathy:    She has cervical adenopathy.  Neurological: She is alert and oriented to person, place, and time.  Psychiatric: She has a normal mood and affect.    ED Course  Procedures (including critical care time)  Labs Reviewed - No data to display Dg Chest 2 View  11/25/2012  *RADIOLOGY REPORT*  Clinical Data: Cough, congestion, fever  CHEST - 2 VIEW  Comparison: 08/30/2004  Findings: Cardiomediastinal silhouette is stable.  No acute infiltrate or pleural effusion.  No pulmonary edema.  Bony thorax is unremarkable.  IMPRESSION: No active disease.   Original Report Authenticated By: Natasha Mead, M.D.      1. Viral syndrome       MDM  Likely flu, outside 28 hour tamiflu window. Pt with no co-morbidities.   Discussed case with attending who  agrees with plan and stability to d/c to home.   Chest x-ray shows no evidence of infiltrate. Discussed results with patient plan is to DC home with symptomatic control.   Pt verbalized understanding and agrees with care plan. Outpatient follow-up and return precautions given.    New Prescriptions   HYDROCODONE-ACETAMINOPHEN (LORTAB) 7.5-500 MG/15ML SOLUTION    Take 10 mLs by mouth every 6 (six) hours as needed for cough.         Wynetta Emery, PA-C 11/25/12 1134

## 2013-04-27 ENCOUNTER — Emergency Department (HOSPITAL_COMMUNITY): Payer: BC Managed Care – PPO

## 2013-04-27 ENCOUNTER — Emergency Department (HOSPITAL_COMMUNITY)
Admission: EM | Admit: 2013-04-27 | Discharge: 2013-04-28 | Disposition: A | Discharge status: Home / Self Care | Payer: BC Managed Care – PPO | Attending: Emergency Medicine | Admitting: Emergency Medicine

## 2013-04-27 ENCOUNTER — Encounter (HOSPITAL_COMMUNITY): Payer: Self-pay | Admitting: Adult Health

## 2013-04-27 DIAGNOSIS — Z8619 Personal history of other infectious and parasitic diseases: Secondary | ICD-10-CM | POA: Insufficient documentation

## 2013-04-27 DIAGNOSIS — Z87891 Personal history of nicotine dependence: Secondary | ICD-10-CM | POA: Insufficient documentation

## 2013-04-27 DIAGNOSIS — R059 Cough, unspecified: Secondary | ICD-10-CM | POA: Insufficient documentation

## 2013-04-27 DIAGNOSIS — Z79899 Other long term (current) drug therapy: Secondary | ICD-10-CM | POA: Insufficient documentation

## 2013-04-27 DIAGNOSIS — J45901 Unspecified asthma with (acute) exacerbation: Secondary | ICD-10-CM | POA: Insufficient documentation

## 2013-04-27 DIAGNOSIS — J45909 Unspecified asthma, uncomplicated: Secondary | ICD-10-CM

## 2013-04-27 DIAGNOSIS — R05 Cough: Secondary | ICD-10-CM | POA: Insufficient documentation

## 2013-04-27 DIAGNOSIS — R0789 Other chest pain: Secondary | ICD-10-CM | POA: Insufficient documentation

## 2013-04-27 MED ORDER — ALBUTEROL SULFATE (5 MG/ML) 0.5% IN NEBU
5.0000 mg | INHALATION_SOLUTION | Freq: Once | RESPIRATORY_TRACT | Status: AC
  Administered 2013-04-27: 5 mg via RESPIRATORY_TRACT
  Filled 2013-04-27: qty 1

## 2013-04-27 MED ORDER — IPRATROPIUM BROMIDE 0.02 % IN SOLN
0.5000 mg | Freq: Once | RESPIRATORY_TRACT | Status: AC
  Administered 2013-04-27: 0.5 mg via RESPIRATORY_TRACT

## 2013-04-27 MED ORDER — IPRATROPIUM BROMIDE 0.02 % IN SOLN
0.5000 mg | Freq: Once | RESPIRATORY_TRACT | Status: AC
  Administered 2013-04-27: 0.5 mg via RESPIRATORY_TRACT
  Filled 2013-04-27: qty 2.5

## 2013-04-27 MED ORDER — ALBUTEROL SULFATE HFA 108 (90 BASE) MCG/ACT IN AERS
2.0000 | INHALATION_SPRAY | Freq: Once | RESPIRATORY_TRACT | Status: AC
  Administered 2013-04-27: 2 via RESPIRATORY_TRACT
  Filled 2013-04-27 (×2): qty 6.7

## 2013-04-27 MED ORDER — PREDNISONE 20 MG PO TABS
60.0000 mg | ORAL_TABLET | Freq: Once | ORAL | Status: AC
  Administered 2013-04-27: 60 mg via ORAL
  Filled 2013-04-27 (×2): qty 3

## 2013-04-27 NOTE — ED Notes (Signed)
Reports being out of inhaler today. Bilateral expiratory wheezes and cough. She is speaking in full sentences. sats 93% RA.

## 2013-04-27 NOTE — ED Notes (Signed)
Patient transported to X-ray 

## 2013-04-28 MED ORDER — ALBUTEROL SULFATE HFA 108 (90 BASE) MCG/ACT IN AERS: 1.0000 | INHALATION_SPRAY | Freq: Four times a day (QID) | RESPIRATORY_TRACT | Status: DC | PRN

## 2013-04-28 NOTE — ED Provider Notes (Signed)
Medical screening examination/treatment/procedure(s) were performed by non-physician practitioner and as supervising physician I was immediately available for consultation/collaboration.   Carleene Cooper III, MD 04/28/13 [phone]

## 2013-04-28 NOTE — ED Provider Notes (Signed)
History     CSN: [id number]  Arrival date & time 04/27/13  2144   First MD Initiated Contact with Patient 04/27/13 2239      Chief Complaint  Patient presents with  . Asthma    (Consider location/radiation/quality/duration/timing/severity/associated sxs/prior treatment) HPI Comments: 22 y.o. Female with PMHx of asthma presents with an exacerbation of her asthma since earlier today. Pt states that she was out of her inhaler and could not obtain relief. Pt has never been intubated and rates this as an 8/10 in severity of her previous asthma attacks. Pt admits shortness of breath, but is able to speak in complete sentences. Pt denies fever, chest pain, back pain, abdominal pain, dizziness, nausea/vomiting, headache.   Patient is a 22 y.o. female presenting with asthma.  Asthma Associated symptoms include coughing. Pertinent negatives include no chest pain, diaphoresis, fever, headaches, nausea, neck pain, numbness, rash, vomiting or weakness.    Past Medical History  Diagnosis Date  . No pertinent past medical history   . Trichomonas   . Asthma     Past Surgical History  Procedure Laterality Date  . No past surgeries      Family History  Problem Relation Age of Onset  . Other Neg Hx     History  Substance Use Topics  . Smoking status: Former Smoker    Types: Cigarettes  . Smokeless tobacco: Never Used     Comment: 2011  . Alcohol Use: No    OB History   Grav Para Term Preterm Abortions TAB SAB Ect Mult Living   2 1 1  1  1   1       Review of Systems  Constitutional: Negative for fever and diaphoresis.  HENT: Negative for neck pain and neck stiffness.   Eyes: Negative for visual disturbance.  Respiratory: Positive for cough, chest tightness, shortness of breath and wheezing. Negative for apnea.   Cardiovascular: Negative for chest pain and palpitations.  Gastrointestinal: Negative for nausea, vomiting, diarrhea and constipation.  Genitourinary: Negative for  dysuria.  Musculoskeletal: Negative for gait problem.  Skin: Negative for rash.  Neurological: Negative for dizziness, weakness, light-headedness, numbness and headaches.    Allergies  Review of patient's allergies indicates no known allergies.  Home Medications   Current Outpatient Rx  Name  Route  Sig  Dispense  Refill  . albuterol (PROVENTIL HFA;VENTOLIN HFA) 108 (90 BASE) MCG/ACT inhaler   Inhalation   Inhale 2 puffs into the lungs every 6 (six) hours as needed. For shortness of breath           BP 121/59  Pulse 88  Temp(Src) 97.8 F (36.6 C) (Oral)  Resp 18  SpO2 98%  LMP 04/11/2013  Physical Exam  Nursing note and vitals reviewed. Constitutional: She is oriented to person, place, and time. She appears well-developed and well-nourished. No distress.  HENT:  Head: Normocephalic and atraumatic.  Eyes: Conjunctivae and EOM are normal.  Neck: Normal range of motion. Neck supple.  No meningeal signs  Cardiovascular: Normal rate, regular rhythm, normal heart sounds and intact distal pulses.  Exam reveals no gallop and no friction rub.   No murmur heard. Pulmonary/Chest: Effort normal. No respiratory distress. She has wheezes. She has no rales. She exhibits no tenderness.  Bilateral wheezing in upper and lower lobes  Abdominal: Soft. Bowel sounds are normal. She exhibits no distension. There is no tenderness. There is no rebound and no guarding.  Musculoskeletal: Normal range of motion. She exhibits no edema  and no tenderness.  Neurological: She is alert and oriented to person, place, and time. No cranial nerve deficit.  Skin: Skin is warm and dry. She is not diaphoretic. No erythema.  Psychiatric: She has a normal mood and affect.    ED Course  Procedures (including critical care time)  Labs Reviewed - No data to display Dg Chest 2 View (if Patient Has Fever And/or Copd)  04/27/2013  *RADIOLOGY REPORT*  Clinical Data:  Asthma, cough, shortness of breath  CHEST - 2  VIEW  Comparison: 11/25/2012  Findings: Normal heart size, mediastinal contours, and pulmonary vascularity. Lungs clear. No pleural effusion or pneumothorax. Minimal central peribronchial thickening, chronic. Bones unremarkable.  IMPRESSION: Minimal chronic peribronchial thickening which could be related to bronchitis or asthma. No acute infiltrate.   Original Report Authenticated By: Ulyses Southward, M.D.      1. Asthma       MDM  Patient presents with bilateral wheezing as a result of asthma attack. No relief after one nebulizer. Will give a second neb, and re-eval. CXR negative.   Pt still wheezing after 2nd neb. Will give dose of prednisone and 2 puffs of albuterol inhaler and re-eval.   Wheezing has subsided. O2 sats increased from 93% on room air to 98% on room air. Pt states is breathing at baseline. Pt has insurance and was encouraged to establish relationship with primary care doctor and pulmonologist as she states she feels her asthma is getting harder to control with just an inhaler.   At this time there does not appear to be any evidence of an acute emergency medical condition and the patient appears stable for discharge with appropriate outpatient follow up.Diagnosis was discussed with patient who verbalizes understanding and is agreeable to discharge. Pt case discussed with Dr. Ignacia Palma who agrees with my plan.    Glade Nurse, PA-C 04/28/13 [phone]

## 2013-05-03 ENCOUNTER — Ambulatory Visit (INDEPENDENT_AMBULATORY_CARE_PROVIDER_SITE_OTHER): Payer: BC Managed Care – PPO | Admitting: Internal Medicine

## 2013-05-03 ENCOUNTER — Encounter: Payer: Self-pay | Admitting: Internal Medicine

## 2013-05-03 VITALS — BP 112/62 | HR 63 | Temp 97.1°F | Ht 63.5 in | Wt 226.0 lb

## 2013-05-03 DIAGNOSIS — J45909 Unspecified asthma, uncomplicated: Secondary | ICD-10-CM

## 2013-05-03 MED ORDER — PREDNISONE (PAK) 10 MG PO TABS: ORAL_TABLET | ORAL | Status: DC

## 2013-05-03 MED ORDER — MOMETASONE FURO-FORMOTEROL FUM 200-5 MCG/ACT IN AERO: INHALATION_SPRAY | RESPIRATORY_TRACT | Status: DC

## 2013-05-03 NOTE — Patient Instructions (Addendum)
Prednisone 10 mg take  4 each am x 2 days,   2 each am x 2 days,  1 each am x2days and stop   dulera 200 Take 2 puffs first thing in am and then another 2 puffs about 12 hours later.   Only use your albuterol (proaire)  as a rescue medication to be used if you can't catch your breath by resting or doing a relaxed purse lip breathing pattern. The less you use it, the better it will work when you need it.  Goal is less than twice daily.  Please schedule a follow up office visit in 2 weeks, sooner if needed to see Tammy on return

## 2013-05-03 NOTE — Progress Notes (Signed)
  Subjective:    Patient ID: Becky Scott, female    DOB: [birth year] MRN: [id number]  HPI  22 yowf problems as infant with asthma always needed inhalers esp in springtime started smoking as teenager made it worse so quit Dec 2013 then again  worse in spring 2014 > ER > refer 05/03/2013 to pulmonary clinic.  05/03/2013 1st pulmonary ov/ cc chest tightness, sob worse when lie down which makes her cough> to ER 5/9 and better after more albuterol but continues to use it up to every 3 hours and did so w/in 2 h of ov. Symptoms occure 24/7 but esp at hs, cough mostly dry.  No obvious daytime variabilty or assoc   Cp  Or overt sinus or hb symptoms. No unusual exp hx or h/o childhood pnar or premature birth to her knowledge.     Also denies any obvious fluctuation of symptoms with weather or environmental changes or other aggravating or alleviating factors except as outlined above    Review of Systems  Constitutional: Negative for fever, chills and unexpected weight change.  HENT: Positive for congestion and sneezing. Negative for ear pain, nosebleeds, sore throat, rhinorrhea, trouble swallowing, dental problem, voice change, postnasal drip and sinus pressure.   Eyes: Negative for visual disturbance.  Respiratory: Positive for cough and shortness of breath. Negative for choking.   Cardiovascular: Negative for chest pain and leg swelling.  Gastrointestinal: Negative for vomiting, abdominal pain and diarrhea.  Genitourinary: Negative for difficulty urinating.  Musculoskeletal: Negative for arthralgias.  Skin: Negative for rash.  Neurological: Negative for tremors, syncope and headaches.  Hematological: Does not bruise/bleed easily.       Objective:   Physical Exam  amb wf nad Wt Readings from Last 3 Encounters:  05/03/13 226 lb (102.513 kg)  09/20/12 203 lb (92.08 kg)  12/21/11 205 lb (92.987 kg)    HEENT: nl dentition, turbinates, and orophanx. Nl external ear canals without cough  reflex   NECK :  without JVD/Nodes/TM/ nl carotid upstrokes bilaterally   LUNGS: no acc muscle use, clear to A and P bilaterally without cough on insp or exp maneuvers   CV:  RRR  no s3 or murmur or increase in P2, no edema   ABD:  soft and nontender with nl excursion in the supine position. No bruits or organomegaly, bowel sounds nl  MS:  warm without deformities, calf tenderness, cyanosis or clubbing  SKIN: warm and dry without lesions    NEURO:  alert, approp, no deficits     cxr 04/27/13 Minimal chronic peribronchial thickening which could be related to  bronchitis or asthma.  No acute infiltrate.       Assessment & Plan:

## 2013-05-04 DIAGNOSIS — J4520 Mild intermittent asthma, uncomplicated: Secondary | ICD-10-CM | POA: Insufficient documentation

## 2013-05-04 NOTE — Assessment & Plan Note (Signed)
DDX of  difficult airways managment all start with A and  include Adherence, Ace Inhibitors, Acid Reflux, Active Sinus Disease, Alpha 1 Antitripsin deficiency, Anxiety masquerading as Airways dz,  ABPA,  allergy(esp in young), Aspiration (esp in elderly), Adverse effects of DPI,  Active smokers, plus two Bs  = Bronchiectasis and Beta blocker use..and one C= CHF   In this case Adherence is the biggest issue and starts with  inability to use HFA effectively and also  understand that SABA treats the symptoms but doesn't get to the underlying problem (inflammation).  I used  the analogy of putting steroid cream on a rash to help explain the meaning of topical therapy and the need to get the drug to the target tissue.    The proper method of use, as well as anticipated side effects, of a metered-dose inhaler are discussed and demonstrated to the patient. Improved effectiveness after extensive coaching during this visit to a level of approximately  75% so start dulera 200 2bid and see if can reduce saba use  ? Acid reflux or Active sinus dz > consider further w/u next

## 2013-05-17 ENCOUNTER — Ambulatory Visit (INDEPENDENT_AMBULATORY_CARE_PROVIDER_SITE_OTHER): Payer: BC Managed Care – PPO | Admitting: Adult Health

## 2013-05-17 ENCOUNTER — Encounter: Payer: Self-pay | Admitting: Adult Health

## 2013-05-17 VITALS — BP 132/72 | HR 75 | Temp 98.0°F | Ht 64.25 in | Wt 224.8 lb

## 2013-05-17 DIAGNOSIS — J45909 Unspecified asthma, uncomplicated: Secondary | ICD-10-CM

## 2013-05-17 MED ORDER — MOMETASONE FURO-FORMOTEROL FUM 200-5 MCG/ACT IN AERO: INHALATION_SPRAY | RESPIRATORY_TRACT | Status: DC

## 2013-05-17 NOTE — Assessment & Plan Note (Signed)
Compensated on present regimen .   Plan  Continue on Dulera 200 Take 2 puffs first thing in am and then another 2 puffs about 12 hours later.   Only use your albuterol (proaire)  as a rescue medication to be used if you can't catch your breath by resting or doing a relaxed purse lip breathing pattern. The less you use it, the better it will work when you need it.  Goal is less than twice daily.  Please schedule a follow up office visit in  3 months with Dr. Sherene Sires

## 2013-05-17 NOTE — Patient Instructions (Addendum)
Continue on Dulera 200 Take 2 puffs first thing in am and then another 2 puffs about 12 hours later.   Only use your albuterol (proaire)  as a rescue medication to be used if you can't catch your breath by resting or doing a relaxed purse lip breathing pattern. The less you use it, the better it will work when you need it.  Goal is less than twice daily.  Please schedule a follow up office visit in  3 months with Dr. Sherene Sires

## 2013-05-17 NOTE — Progress Notes (Signed)
  Subjective:    Patient ID: Becky Scott, female    DOB: 10-Feb-1991 MRN: [id number]  HPI  22 yowf problems as infant with asthma always needed inhalers esp in springtime started smoking as teenager made it worse so quit Dec 2013 then again  worse in spring 2014 > ER > refer 05/03/2013 to pulmonary clinic.  05/03/2013 1st pulmonary ov/ cc chest tightness, sob worse when lie down which makes her cough> to ER 5/9 and better after more albuterol but continues to use it up to every 3 hours and did so w/in 2 h of ov. Symptoms occure 24/7 but esp at hs, cough mostly dry. > pred taper , dulera rx   05/17/2013 Follow up  2 week follow up asthma - reports breathing is doing well on the dulera and she has not had to use SABA since last ov. Feeling much better.  No wheezing , cough or nocturnal awakening.  Tolerating Dulera well.  Needs rx sent to pharm.    Review of Systems  Constitutional: Negative for fever, chills and unexpected weight change.  HENT:  Negative for ear pain, nosebleeds, sore throat, rhinorrhea, trouble swallowing, dental problem, voice change, postnasal drip and sinus pressure.   Eyes: Negative for visual disturbance.  Respiratory:  Neg wheezing   Negative for choking.   Cardiovascular: Negative for chest pain and leg swelling.  Gastrointestinal: Negative for vomiting, abdominal pain and diarrhea.  Genitourinary: Negative for difficulty urinating.  Musculoskeletal: Negative for arthralgias.  Skin: Negative for rash.  Neurological: Negative for tremors, syncope and headaches.  Hematological: Does not bruise/bleed easily.       Objective:   Physical Exam  amb wf nad    HEENT: nl dentition, turbinates, and orophanx. Nl external ear canals without cough reflex   NECK :  without JVD/Nodes/TM/ nl carotid upstrokes bilaterally   LUNGS: no acc muscle use, clear to A and P bilaterally without cough on insp or exp maneuvers   CV:  RRR  no s3 or murmur or increase in P2,  no edema   ABD:  soft and nontender with nl excursion in the supine position. No bruits or organomegaly, bowel sounds nl  MS:  warm without deformities, calf tenderness, cyanosis or clubbing  SKIN: warm and dry without lesions    NEURO:  alert, approp, no deficits     cxr 04/27/13 Minimal chronic peribronchial thickening which could be related to  bronchitis or asthma.  No acute infiltrate.       Assessment & Plan:

## 2013-07-18 ENCOUNTER — Telehealth: Payer: Self-pay | Admitting: Internal Medicine

## 2013-07-18 NOTE — Telephone Encounter (Signed)
Called patient x3 and lm for return appointment. No return call back. Sent letter 07/18/13. °

## 2013-09-15 ENCOUNTER — Inpatient Hospital Stay (HOSPITAL_COMMUNITY)
Admission: AD | Admit: 2013-09-15 | Discharge: 2013-09-15 | Disposition: A | Discharge status: Home / Self Care | Payer: BC Managed Care – PPO | Source: Ambulatory Visit | Attending: Obstetrics and Gynecology | Admitting: Obstetrics and Gynecology

## 2013-09-15 DIAGNOSIS — R300 Dysuria: Secondary | ICD-10-CM | POA: Insufficient documentation

## 2013-09-15 DIAGNOSIS — S3141XA Laceration without foreign body of vagina and vulva, initial encounter: Secondary | ICD-10-CM

## 2013-09-15 DIAGNOSIS — X58XXXA Exposure to other specified factors, initial encounter: Secondary | ICD-10-CM | POA: Insufficient documentation

## 2013-09-15 DIAGNOSIS — Y92009 Unspecified place in unspecified non-institutional (private) residence as the place of occurrence of the external cause: Secondary | ICD-10-CM | POA: Insufficient documentation

## 2013-09-15 DIAGNOSIS — S3140XA Unspecified open wound of vagina and vulva, initial encounter: Secondary | ICD-10-CM

## 2013-09-15 LAB — URINALYSIS, DIPSTICK ONLY
Protein, ur: NEGATIVE mg/dL
Urobilinogen, UA: 0.2 mg/dL (ref 0.0–1.0)

## 2013-09-15 LAB — POCT PREGNANCY, URINE: Preg Test, Ur: NEGATIVE

## 2013-09-15 MED ORDER — LIDOCAINE HCL 2 % EX GEL
CUTANEOUS | Status: AC
  Administered 2013-09-15: 5 via TOPICAL
  Filled 2013-09-15: qty 20

## 2013-09-15 MED ORDER — BENZOCAINE-MENTHOL 20-0.5 % EX AERO: 1.0000 "application " | INHALATION_SPRAY | Freq: Four times a day (QID) | CUTANEOUS | Status: DC | PRN

## 2013-09-15 MED ORDER — LIDOCAINE HCL 2 % EX GEL: CUTANEOUS | Status: DC | PRN

## 2013-09-15 NOTE — MAU Note (Signed)
23 yo, G2P1 with LMP 08/31/13, presents to MAU with c/o vaginal irritation and bleeding after intercourse last night. Reports her boyfriend's finger nail cut her vagina and since that time she is experiencing burning with urination.  Patient uses Implanon for contraception.

## 2013-09-15 NOTE — MAU Provider Note (Signed)
Chief Complaint: Body Laceration   First Provider Initiated Contact with Patient 09/15/13 1152     SUBJECTIVE HPI: Becky Scott is a 22 y.o. G2P1011 who presents to maternity admissions reporting vaginal laceration caused by her boyfriend's fingernail last night that is causing burning with urination today.  She reports some bleeding from the laceration immediately after it occurred, but no bleeding now.  She denies vaginal bleeding, vaginal itching/burning, h/a, dizziness, n/v, or fever/chills.   Past Medical History  Diagnosis Date  . No pertinent past medical history   . Trichomonas   . Asthma    Past Surgical History  Procedure Laterality Date  . No past surgeries     History   Social History  . Marital Status: Single    Spouse Name: N/A    Number of Children: 1  . Years of Education: N/A   Occupational History  . ASST MANAGER    Social History Main Topics  . Smoking status: Former Smoker -- 0.50 packs/day for .5 years    Types: Cigarettes    Quit date: 11/19/2012  . Smokeless tobacco: Never Used  . Alcohol Use: No  . Drug Use: No  . Sexual Activity: Yes    Birth Control/ Protection: Implant   Other Topics Concern  . Not on file   Social History Narrative  . No narrative on file   No current facility-administered medications on file prior to encounter.   Current Outpatient Prescriptions on File Prior to Encounter  Medication Sig Dispense Refill  . albuterol (PROVENTIL HFA;VENTOLIN HFA) 108 (90 BASE) MCG/ACT inhaler Inhale 2 puffs into the lungs every 6 (six) hours as needed. For shortness of breath      . mometasone-formoterol (DULERA) 200-5 MCG/ACT AERO Take 2 puffs first thing in am and then another 2 puffs about 12 hours later.  1 Inhaler  6   No Known Allergies  ROS: Pertinent items in HPI  OBJECTIVE Blood pressure 126/84, pulse 91, temperature 97.6 F (36.4 C), temperature source Oral, resp. rate 16, last menstrual period 08/31/2013. GENERAL:  Well-developed, well-nourished female in no acute distress.  HEENT: Normocephalic HEART: normal rate RESP: normal effort ABDOMEN: Soft, non-tender EXTREMITIES: Nontender, no edema NEURO: Alert and oriented  Upon visual inspection, 4cm by 0.25 cm laceration on inner left labia, mild erythema, no edema noted  LAB RESULTS Results for orders placed during the hospital encounter of 09/15/13 (from the past 24 hour(s))  POCT PREGNANCY, URINE     Status: None   Collection Time    09/15/13 11:22 AM      Result Value Range   Preg Test, Ur NEGATIVE  NEGATIVE   Results for orders placed during the hospital encounter of 09/15/13 (from the past 168 hour(s))  URINALYSIS, DIPSTICK ONLY   Collection Time    09/15/13 11:20 AM      Result Value Range   Specific Gravity, Urine 1.020  1.005 - 1.030   pH 6.5  5.0 - 8.0   Glucose, UA NEGATIVE  NEGATIVE mg/dL   Hgb urine dipstick MODERATE (*) NEGATIVE   Bilirubin Urine NEGATIVE  NEGATIVE   Ketones, ur NEGATIVE  NEGATIVE mg/dL   Protein, ur NEGATIVE  NEGATIVE mg/dL   Urobilinogen, UA 0.2  0.0 - 1.0 mg/dL   Nitrite NEGATIVE  NEGATIVE   Leukocytes, UA MODERATE (*) NEGATIVE  POCT PREGNANCY, URINE   Collection Time    09/15/13 11:22 AM      Result Value Range   Preg Test, Ur  NEGATIVE  NEGATIVE     ASSESSMENT 1. Vaginal laceration, initial encounter     PLAN Lidocaine jelly topical in MAU Discharge home Urine sent for culture Prescription for Lidocaine jelly and Dermoplast sent to pt pharmacy F/U in office or return to MAU as needed    Medication List    ASK your doctor about these medications       acetaminophen 325 MG tablet  Commonly known as:  TYLENOL  Take 650 mg by mouth every 6 (six) hours as needed (headache).     albuterol 108 (90 BASE) MCG/ACT inhaler  Commonly known as:  PROVENTIL HFA;VENTOLIN HFA  Inhale 2 puffs into the lungs every 6 (six) hours as needed. For shortness of breath     IMPLANON [place]  Inject into the skin  continuous.     mometasone-formoterol 200-5 MCG/ACT Aero  Commonly known as:  DULERA  Take 2 puffs first thing in am and then another 2 puffs about 12 hours later.         Sharen Counter Certified Nurse-Midwife 09/15/2013  11:53 AM

## 2014-10-21 ENCOUNTER — Encounter: Payer: Self-pay | Admitting: Adult Health

## 2015-04-03 ENCOUNTER — Encounter: Payer: Self-pay | Admitting: Internal Medicine

## 2015-04-03 ENCOUNTER — Ambulatory Visit (INDEPENDENT_AMBULATORY_CARE_PROVIDER_SITE_OTHER): Payer: BLUE CROSS/BLUE SHIELD | Admitting: Internal Medicine

## 2015-04-03 VITALS — BP 112/80 | HR 82 | Ht 63.0 in | Wt 252.0 lb

## 2015-04-03 DIAGNOSIS — J4520 Mild intermittent asthma, uncomplicated: Secondary | ICD-10-CM | POA: Diagnosis not present

## 2015-04-03 MED ORDER — MOMETASONE FURO-FORMOTEROL FUM 200-5 MCG/ACT IN AERO: INHALATION_SPRAY | RESPIRATORY_TRACT | Status: DC

## 2015-04-03 NOTE — Progress Notes (Signed)
Subjective:    Patient ID: Becky Scott, female    DOB: 10/17/1991 MRN: [id number]    Brief patient profile:  24  yowf problems as infant with asthma always needed inhalers esp in springtime started smoking as teenager made it worse so quit Dec 2013 then again  worse in spring 2014 > ER > refer 05/03/2013 to pulmonary clinic.   History of Present Illness  05/03/2013 1st pulmonary ov/ cc chest tightness, sob worse when lie down which makes her cough> to ER 5/9 and better after more albuterol but continues to use it up to every 3 hours and did so w/in 2 h of ov. Symptoms occure 24/7 but esp at hs, cough mostly dry. > pred taper , dulera rx     05/17/2013 Follow up  2 week follow up asthma - reports breathing is doing well on the dulera and she has not had to use SABA since last ov. Feeling much better.  No wheezing , cough or nocturnal awakening.  Tolerating Dulera well.  Needs rx sent to pharm.  rec Continue on Dulera 200 Take 2 puffs first thing in am and then another 2 puffs about 12 hours later.  Only use your albuterol (proaire) as rescue   04/03/2015 f/u ov/[name] re: intermittent asthma / not on maint rx  Chief Complaint  Patient presents with  . Follow-up    Pt c/o increased SOB for the past few wks. She states that she usually only has problem this time of year. She has been out of [facility] for over a yr now and does not have rescue inhaler. She also c/o cough- non prod and mainly bothers her at night.   variable sob/cough esp with spring season, rest of the year s doe/ coughing Not convinced saba works but had no problem on dulera 200  Worse x one month first thing in am and before bed does not typically wake her up though    No other obvious day to day or daytime variabilty or assoc chronic cough or cp or chest tightness, subjective wheeze overt sinus or hb symptoms. No unusual exp hx or h/o childhood pna/ asthma or knowledge of premature birth.  Sleeping ok without nocturnal   or early am exacerbation  of respiratory  c/o's or need for noct saba. Also denies any obvious fluctuation of symptoms with weather or environmental changes or other aggravating or alleviating factors except as outlined above   Current Medications, Allergies, Complete Past Medical History, Past Surgical History, Family History, and Social History were reviewed in [employer] record.  ROS  The following are not active complaints unless bolded sore throat, dysphagia, dental problems, itching, sneezing,  nasal congestion or excess/ purulent secretions, ear ache,   fever, chills, sweats, unintended wt loss, pleuritic or exertional cp, hemoptysis,  orthopnea pnd or leg swelling, presyncope, palpitations, heartburn, abdominal pain, anorexia, nausea, vomiting, diarrhea  or change in bowel or urinary habits, change in stools or urine, dysuria,hematuria,  rash, arthralgias, visual complaints, headache, numbness weakness or ataxia or problems with walking or coordination,  change in mood/affect or memory.              Objective:   Physical Exam  amb wf nad    Wt Readings from Last 3 Encounters:  04/03/15 252 lb (114.306 kg)  05/17/13 224 lb 12.8 oz (101.969 kg)  05/03/13 226 lb (102.513 kg)    Vital signs reviewed    HEENT: nl dentition, turbinates, and orophanx.  Nl external ear canals without cough reflex   NECK :  without JVD/Nodes/TM/ nl carotid upstrokes bilaterally   LUNGS: no acc muscle use, clear to A and P bilaterally without cough on insp or exp maneuvers   CV:  RRR  no s3 or murmur or increase in P2, no edema   ABD:  soft and nontender with nl excursion in the supine position. No bruits or organomegaly, bowel sounds nl  MS:  warm without deformities, calf tenderness, cyanosis or clubbing  SKIN: warm and dry without lesions    NEURO:  alert, approp, no deficits     cxr 04/27/13 Minimal chronic peribronchial thickening which could be related to   bronchitis or asthma.  No acute infiltrate.       Assessment & Plan:

## 2015-04-03 NOTE — Patient Instructions (Signed)
Resume dulera 200 Take 2 puffs first thing in am and then another 2 puffs about 12 hours later x 2 weeks then as needed thereafter  Don't fill the prescription if not convinced it works    If you are satisfied with your treatment plan,  let your doctor know and he/she can either refill your medications or you can return here when your prescription runs out.     If in any way you are not 100% satisfied,  please tell us.  If 100% better, tell your friends!  Pulmonary follow up is as needed

## 2015-04-04 ENCOUNTER — Encounter: Payer: Self-pay | Admitting: Internal Medicine

## 2015-04-04 NOTE — Assessment & Plan Note (Signed)
She appears to have more of a cough variant pattern but clearly more of an intermittent than chronic asthmatic  I had an extended discussion with the patient reviewing all relevant studies completed to date and  lasting 15 to 20 minutes of a 25 minute visit on the following ongoing concerns:  1) The proper method of use, as well as anticipated side effects, of a metered-dose inhaler are discussed and demonstrated to the patient. Improved effectiveness after extensive coaching during this visit to a level of approximately  90%  2) reviewed need to start dulera 200 2bid immediately at onset of any cough / wheeze and taper it slowly off over several weeks as the immediate benefit of the bronchodilator is lost in 12 hours but the ics effect can last up to a week so tapering off has to be done slowly with the understanding a relapse from a step down in dosing can occur a week after the step down occurs.  3) Each maintenance medication was reviewed in detail including most importantly the difference between maintenance and as needed and under what circumstances the prns are to be used.  Please see instructions for details which were reviewed in writing and the patient given a copy.   Pulmonary f/u can be prn

## 2015-04-17 ENCOUNTER — Telehealth: Payer: Self-pay | Admitting: *Deleted

## 2015-04-17 NOTE — Telephone Encounter (Signed)
PA request for Dulera 200 mcg came from [facility]. PA request submitted via phone with [employer]. [phone] Put in as urgent request and it went to clinical team for decision which will be faxed to office in 24-72 hrs.

## 2015-04-18 MED ORDER — BUDESONIDE-FORMOTEROL FUMARATE 160-4.5 MCG/ACT IN AERO: 2.0000 | INHALATION_SPRAY | Freq: Two times a day (BID) | RESPIRATORY_TRACT | Status: DC

## 2015-04-18 NOTE — Telephone Encounter (Signed)
Dulera denied  She must try and fail advair disc, advair HFA, breo and symbicort  Please advise thanks

## 2015-04-18 NOTE — Telephone Encounter (Signed)
symbicort 160 is the same drug in a different package (like different types of aspirin) so rec 2 bid   We have her as having no insurance so be sure to sort this out for future reference

## 2015-04-18 NOTE — Telephone Encounter (Signed)
Pt aware of recs.  Nothing further needed. 

## 2015-05-21 ENCOUNTER — Other Ambulatory Visit (INDEPENDENT_AMBULATORY_CARE_PROVIDER_SITE_OTHER): Payer: BLUE CROSS/BLUE SHIELD

## 2015-05-21 ENCOUNTER — Encounter: Payer: Self-pay | Admitting: Family

## 2015-05-21 ENCOUNTER — Ambulatory Visit (INDEPENDENT_AMBULATORY_CARE_PROVIDER_SITE_OTHER): Payer: BLUE CROSS/BLUE SHIELD | Admitting: Family

## 2015-05-21 ENCOUNTER — Telehealth: Payer: Self-pay | Admitting: Family

## 2015-05-21 VITALS — BP 118/80 | HR 60 | Temp 98.1°F | Resp 18 | Ht 63.0 in | Wt 247.8 lb

## 2015-05-21 DIAGNOSIS — Z0000 Encounter for general adult medical examination without abnormal findings: Secondary | ICD-10-CM

## 2015-05-21 LAB — COMPREHENSIVE METABOLIC PANEL
ALT: 12 U/L (ref 0–35)
AST: 17 U/L (ref 0–37)
Albumin: 4.4 g/dL (ref 3.5–5.2)
Alkaline Phosphatase: 73 U/L (ref 39–117)
BUN: 16 mg/dL (ref 6–23)
CHLORIDE: 102 meq/L (ref 96–112)
CO2: 27 meq/L (ref 19–32)
Calcium: 9.1 mg/dL (ref 8.4–10.5)
Creatinine, Ser: 0.71 mg/dL (ref 0.40–1.20)
GFR: 107.31 mL/min (ref >60.00)
GLUCOSE: 81 mg/dL (ref 70–99)
Potassium: 4 mEq/L (ref 3.5–5.1)
Sodium: 136 mEq/L (ref 135–145)
Total Bilirubin: 1 mg/dL (ref 0.2–1.2)
Total Protein: 7.6 g/dL (ref 6.0–8.3)

## 2015-05-21 LAB — CBC
HEMATOCRIT: 40.6 % (ref 36.0–46.0)
Hemoglobin: 13.5 g/dL (ref 12.0–15.0)
MCHC: 33.3 g/dL (ref 30.0–36.0)
MCV: 84.5 fl (ref 78.0–100.0)
Platelets: 259 10*3/uL (ref 150.0–400.0)
RBC: 4.8 Mil/uL (ref 3.87–5.11)
RDW: 12.9 % (ref 11.5–15.5)
WBC: 9.9 10*3/uL (ref 4.0–10.5)

## 2015-05-21 LAB — LIPID PANEL
CHOL/HDL RATIO: 3
CHOLESTEROL: 144 mg/dL (ref 0–200)
HDL: 43.5 mg/dL (ref >39.00)
LDL Cholesterol: 89 mg/dL (ref 0–99)
NonHDL: 100.5
Triglycerides: 60 mg/dL (ref 0.0–149.0)
VLDL: 12 mg/dL (ref 0.0–40.0)

## 2015-05-21 LAB — TSH: TSH: 1.54 u[IU]/mL (ref 0.35–4.50)

## 2015-05-21 LAB — HEMOGLOBIN A1C: Hgb A1c MFr Bld: 5 % (ref 4.6–6.5)

## 2015-05-21 NOTE — Progress Notes (Signed)
Pre visit review using our clinic review tool, if applicable. No additional management support is needed unless otherwise documented below in the visit note. 

## 2015-05-21 NOTE — Assessment & Plan Note (Signed)
1) Anticipatory Guidance: Discussed importance of wearing a seatbelt while driving and not texting while driving; changing batteries in smoke detector at least once annually; wearing suntan lotion when outside; eating a balanced and moderate diet; getting physical activity at least 30 minutes per day.  2) Immunizations / Screenings / Labs:  Do for a tetanus shot. Left office prior to receiving tetanus shot. All other immunizations are up-to-date per recommendations. Due for a Pap smear which is scheduled for July. All other screenings are up-to-date per recommendations.Obtain CBC, CMET, A1c, Lipid profile and TSH.   Vital exam. Patient has minimal risk factors for cardiovascular disease with the exception of her BMI of 43. Established goal of 5-10% weight loss through increased physical activity and improved nutrient density. Continue other healthy lifestyle choices. Follow up prevention exam in 1 year. Follow-up office visit pending lab work.

## 2015-05-21 NOTE — Patient Instructions (Addendum)
Thank you for choosing [facility].  Summary/Instructions:  Please stop by the lab on the basement level of the building for your blood work. Your results will be released to MyChart (or called to you) after review, usually within 72 hours after test completion. If any changes need to be made, you will be notified at that same time.  If your symptoms worsen or fail to improve, please contact our office for further instruction, or in case of emergency go directly to the emergency room at the closest medical facility.    Health Maintenance Adopting a healthy lifestyle and getting preventive care can go a long way to promote health and wellness. Talk with your health care provider about what schedule of regular examinations is right for you. This is a good chance for you to check in with your provider about disease prevention and staying healthy. In between checkups, there are plenty of things you can do on your own. Experts have done a lot of research about which lifestyle changes and preventive measures are most likely to keep you healthy. Ask your health care provider for more information. WEIGHT AND DIET  Eat a healthy diet  Be sure to include plenty of vegetables, fruits, low-fat dairy products, and lean protein.  Do not eat a lot of foods high in solid fats, added sugars, or salt.  Get regular exercise. This is one of the most important things you can do for your health.  Most adults should exercise for at least 150 minutes each week. The exercise should increase your heart rate and make you sweat (moderate-intensity exercise).  Most adults should also do strengthening exercises at least twice a week. This is in addition to the moderate-intensity exercise.  Maintain a healthy weight  Body mass index (BMI) is a measurement that can be used to identify possible weight problems. It estimates body fat based on height and weight. Your health care provider can help determine your BMI  and help you achieve or maintain a healthy weight.  For females 20 years of age and older:   A BMI below 18.5 is considered underweight.  A BMI of 18.5 to 24.9 is normal.  A BMI of 25 to 29.9 is considered overweight.  A BMI of 30 and above is considered obese.  Watch levels of cholesterol and blood lipids  You should start having your blood tested for lipids and cholesterol at 24 years of age, then have this test every 5 years.  You may need to have your cholesterol levels checked more often if:  Your lipid or cholesterol levels are high.  You are older than 24 years of age.  You are at high risk for heart disease.  CANCER SCREENING   Lung Cancer  Lung cancer screening is recommended for adults 55-80 years old who are at high risk for lung cancer because of a history of smoking.  A yearly low-dose CT scan of the lungs is recommended for people who:  Currently smoke.  Have quit within the past 15 years.  Have at least a 30-pack-year history of smoking. A pack year is smoking an average of one pack of cigarettes a day for 1 year.  Yearly screening should continue until it has been 15 years since you quit.  Yearly screening should stop if you develop a health problem that would prevent you from having lung cancer treatment.  Breast Cancer  Practice breast self-awareness. This means understanding how your breasts normally appear and feel.  It   also means doing regular breast self-exams. Let your health care provider know about any changes, no matter how small.  If you are in your 20s or 30s, you should have a clinical breast exam (CBE) by a health care provider every 1-3 years as part of a regular health exam.  If you are 40 or older, have a CBE every year. Also consider having a breast X-ray (mammogram) every year.  If you have a family history of breast cancer, talk to your health care provider about genetic screening.  If you are at high risk for breast cancer,  talk to your health care provider about having an MRI and a mammogram every year.  Breast cancer gene (BRCA) assessment is recommended for women who have family members with BRCA-related cancers. BRCA-related cancers include:  Breast.  Ovarian.  Tubal.  Peritoneal cancers.  Results of the assessment will determine the need for genetic counseling and BRCA1 and BRCA2 testing. Cervical Cancer Routine pelvic examinations to screen for cervical cancer are no longer recommended for nonpregnant women who are considered low risk for cancer of the pelvic organs (ovaries, uterus, and vagina) and who do not have symptoms. A pelvic examination may be necessary if you have symptoms including those associated with pelvic infections. Ask your health care provider if a screening pelvic exam is right for you.   The Pap test is the screening test for cervical cancer for women who are considered at risk.  If you had a hysterectomy for a problem that was not cancer or a condition that could lead to cancer, then you no longer need Pap tests.  If you are older than 65 years, and you have had normal Pap tests for the past 10 years, you no longer need to have Pap tests.  If you have had past treatment for cervical cancer or a condition that could lead to cancer, you need Pap tests and screening for cancer for at least 20 years after your treatment.  If you no longer get a Pap test, assess your risk factors if they change (such as having a new sexual partner). This can affect whether you should start being screened again.  Some women have medical problems that increase their chance of getting cervical cancer. If this is the case for you, your health care provider may recommend more frequent screening and Pap tests.  The human papillomavirus (HPV) test is another test that may be used for cervical cancer screening. The HPV test looks for the virus that can cause cell changes in the cervix. The cells collected  during the Pap test can be tested for HPV.  The HPV test can be used to screen women 30 years of age and older. Getting tested for HPV can extend the interval between normal Pap tests from three to five years.  An HPV test also should be used to screen women of any age who have unclear Pap test results.  After 24 years of age, women should have HPV testing as often as Pap tests.  Colorectal Cancer  This type of cancer can be detected and often prevented.  Routine colorectal cancer screening usually begins at 24 years of age and continues through 24 years of age.  Your health care provider may recommend screening at an earlier age if you have risk factors for colon cancer.  Your health care provider may also recommend using home test kits to check for hidden blood in the stool.  A small camera at the end   of a tube can be used to examine your colon directly (sigmoidoscopy or colonoscopy). This is done to check for the earliest forms of colorectal cancer.  Routine screening usually begins at age 50.  Direct examination of the colon should be repeated every 5-10 years through 24 years of age. However, you may need to be screened more often if early forms of precancerous polyps or small growths are found. Skin Cancer  Check your skin from head to toe regularly.  Tell your health care provider about any new moles or changes in moles, especially if there is a change in a mole's shape or color.  Also tell your health care provider if you have a mole that is larger than the size of a pencil eraser.  Always use sunscreen. Apply sunscreen liberally and repeatedly throughout the day.  Protect yourself by wearing long sleeves, pants, a wide-brimmed hat, and sunglasses whenever you are outside. HEART DISEASE, DIABETES, AND HIGH BLOOD PRESSURE   Have your blood pressure checked at least every 1-2 years. High blood pressure causes heart disease and increases the risk of stroke.  If you are  between 55 years and 79 years old, ask your health care provider if you should take aspirin to prevent strokes.  Have regular diabetes screenings. This involves taking a blood sample to check your fasting blood sugar level.  If you are at a normal weight and have a low risk for diabetes, have this test once every three years after 24 years of age.  If you are overweight and have a high risk for diabetes, consider being tested at a younger age or more often. PREVENTING INFECTION  Hepatitis B  If you have a higher risk for hepatitis B, you should be screened for this virus. You are considered at high risk for hepatitis B if:  You were born in a country where hepatitis B is common. Ask your health care provider which countries are considered high risk.  Your parents were born in a high-risk country, and you have not been immunized against hepatitis B (hepatitis B vaccine).  You have HIV or AIDS.  You use needles to inject street drugs.  You live with someone who has hepatitis B.  You have had sex with someone who has hepatitis B.  You get hemodialysis treatment.  You take certain medicines for conditions, including cancer, organ transplantation, and autoimmune conditions. Hepatitis C  Blood testing is recommended for:  Everyone born from 1945 through 1965.  Anyone with known risk factors for hepatitis C. Sexually transmitted infections (STIs)  You should be screened for sexually transmitted infections (STIs) including gonorrhea and chlamydia if:  You are sexually active and are younger than 24 years of age.  You are older than 24 years of age and your health care provider tells you that you are at risk for this type of infection.  Your sexual activity has changed since you were last screened and you are at an increased risk for chlamydia or gonorrhea. Ask your health care provider if you are at risk.  If you do not have HIV, but are at risk, it may be recommended that you  take a prescription medicine daily to prevent HIV infection. This is called pre-exposure prophylaxis (PrEP). You are considered at risk if:  You are sexually active and do not regularly use condoms or know the HIV status of your partner(s).  You take drugs by injection.  You are sexually active with a partner who has HIV.   Talk with your health care provider about whether you are at high risk of being infected with HIV. If you choose to begin PrEP, you should first be tested for HIV. You should then be tested every 3 months for as long as you are taking PrEP.  PREGNANCY   If you are premenopausal and you may become pregnant, ask your health care provider about preconception counseling.  If you may become pregnant, take 400 to 800 micrograms (mcg) of folic acid every day.  If you want to prevent pregnancy, talk to your health care provider about birth control (contraception). OSTEOPOROSIS AND MENOPAUSE   Osteoporosis is a disease in which the bones lose minerals and strength with aging. This can result in serious bone fractures. Your risk for osteoporosis can be identified using a bone density scan.  If you are 65 years of age or older, or if you are at risk for osteoporosis and fractures, ask your health care provider if you should be screened.  Ask your health care provider whether you should take a calcium or vitamin D supplement to lower your risk for osteoporosis.  Menopause may have certain physical symptoms and risks.  Hormone replacement therapy may reduce some of these symptoms and risks. Talk to your health care provider about whether hormone replacement therapy is right for you.  HOME CARE INSTRUCTIONS   Schedule regular health, dental, and eye exams.  Stay current with your immunizations.   Do not use any tobacco products including cigarettes, chewing tobacco, or electronic cigarettes.  If you are pregnant, do not drink alcohol.  If you are breastfeeding, limit how  much and how often you drink alcohol.  Limit alcohol intake to no more than 1 drink per day for nonpregnant women. One drink equals 12 ounces of beer, 5 ounces of wine, or 1 ounces of hard liquor.  Do not use street drugs.  Do not share needles.  Ask your health care provider for help if you need support or information about quitting drugs.  Tell your health care provider if you often feel depressed.  Tell your health care provider if you have ever been abused or do not feel safe at home. Document Released: 06/21/2011 Document Revised: 04/22/2014 Document Reviewed: 11/07/2013 [employer] Patient Information 2015 [employer]. This information is not intended to replace advice given to you by your health care provider. Make sure you discuss any questions you have with your health care provider.  

## 2015-05-21 NOTE — Telephone Encounter (Signed)
Please inform the patient that her blood work from her physical shows that her kidney function, electrolytes, liver function, white/red blood cells, cholesterol, thyroid, and A1c are all within the normal limits. Therefore she does not have type 2 diabetes. She can plan to follow-up in one year for a physical or sooner if needed.

## 2015-05-21 NOTE — Progress Notes (Signed)
Subjective:    Patient ID: Becky Scott, female    DOB: [birth year], 24 y.o.   MRN: [id number]  Chief Complaint  Patient presents with  . Establish Care    CPE, not fasting    HPI:  Becky Scott is a 24 y.o. female who presents today for an annual wellness visit.   1) Health Maintenance -   Diet - Averages about 3 meals per day consisting of protein shakes, fruits, vegetables, meat and chicken; Denies caffeine.   Exercise - 30 minutes daily of walking.    2) Preventative Exams / Immunizations:  Dental -- Up to date  Vision -- Up to date   Health Maintenance  Topic Date Due  . HIV Screening  03/05/2006  . PAP SMEAR  03/05/2009  . TETANUS/TDAP  03/05/2010  . INFLUENZA VACCINE  07/21/2015  PAP scheduled for July; Tetanus  There is no immunization history for the selected administration types on file for this patient.  No Known Allergies   Outpatient Prescriptions Prior to Visit  Medication Sig Dispense Refill  . budesonide-formoterol (SYMBICORT) 160-4.5 MCG/ACT inhaler Inhale 2 puffs into the lungs 2 (two) times daily. 1 Inhaler 6   No facility-administered medications prior to visit.     Past Medical History  Diagnosis Date  . No pertinent past medical history   . Trichomonas   . Asthma   . Allergy      Past Surgical History  Procedure Laterality Date  . No past surgeries       Family History  Problem Relation Age of Onset  . Breast cancer Mother   . Thyroid cancer Mother   . Prostate cancer Father   . Prostate cancer Paternal Grandfather   . Lung cancer Maternal Grandmother     was a smoker     History   Social History  . Marital Status: Single    Spouse Name: N/A  . Number of Children: 1  . Years of Education: 12   Occupational History  . [employer]/field seismologistAssistant    Social History Main Topics  . Smoking status: Former Smoker -- 0.50 packs/day for .5 years    Types: Cigarettes    Quit date: 11/19/2012  . Smokeless tobacco:  Never Used  . Alcohol Use: No  . Drug Use: No  . Sexual Activity: Yes    Birth Control/ Protection: Implant   Other Topics Concern  . Not on file   Social History Narrative   Fun: Entertains her son.    Denies religious beliefs effecting health care.     Review of Systems  Constitutional: Denies fever, chills, fatigue, or significant weight gain/loss. HENT: Head: Denies headache or neck pain Ears: Denies changes in hearing, ringing in ears, earache, drainage Nose: Denies discharge, stuffiness, itching, nosebleed, sinus pain Throat: Denies sore throat, hoarseness, dry mouth, sores, thrush Eyes: Denies loss/changes in vision, pain, redness, blurry/double vision, flashing lights Cardiovascular: Denies chest pain/discomfort, tightness, palpitations, shortness of breath with activity, difficulty lying down, swelling, sudden awakening with shortness of breath Respiratory: Denies shortness of breath, cough, sputum production, wheezing Gastrointestinal: Denies dysphasia, heartburn, change in appetite, nausea, change in bowel habits, rectal bleeding, constipation, diarrhea, yellow skin or eyes Genitourinary: Denies frequency, urgency, burning/pain, blood in urine, incontinence, change in urinary strength. Musculoskeletal: Denies muscle/joint pain, stiffness, back pain, redness or swelling of joints, trauma Skin: Denies rashes, lumps, itching, dryness, color changes, or hair/nail changes Neurological: Denies dizziness, fainting, seizures, weakness, numbness, tingling, tremor Psychiatric - Denies  nervousness, stress, depression or memory loss Endocrine: Denies heat or cold intolerance, sweating, frequent urination, excessive thirst, changes in appetite Hematologic: Denies ease of bruising or bleeding     Objective:     BP 118/80 mmHg  Pulse 60  Temp(Src) 98.1 F (36.7 C) (Oral)  Resp 18  Ht  (1.6 m)  Wt 247 lb 12.8 oz (112.401 kg)  BMI 43.91 kg/m2  SpO2 98% Nursing note and  vital signs reviewed.  Physical Exam  Constitutional: She is oriented to person, place, and time. She appears well-developed and well-nourished.  HENT:  Head: Normocephalic.  Right Ear: Hearing, tympanic membrane, external ear and ear canal normal.  Left Ear: Hearing, tympanic membrane, external ear and ear canal normal.  Nose: Nose normal.  Mouth/Throat: Uvula is midline, oropharynx is clear and moist and mucous membranes are normal.  Eyes: Conjunctivae and EOM are normal. Pupils are equal, round, and reactive to light.  Neck: Neck supple. No JVD present. No tracheal deviation present. No thyromegaly present.  Cardiovascular: Normal rate, regular rhythm, normal heart sounds and intact distal pulses.   Pulmonary/Chest: Effort normal and breath sounds normal.  Abdominal: Soft. Bowel sounds are normal. She exhibits no distension and no mass. There is no tenderness. There is no rebound and no guarding.  Musculoskeletal: Normal range of motion. She exhibits no edema or tenderness.  Lymphadenopathy:    She has no cervical adenopathy.  Neurological: She is alert and oriented to person, place, and time. She has normal reflexes. No cranial nerve deficit. She exhibits normal muscle tone. Coordination normal.  Skin: Skin is warm and dry.  Psychiatric: She has a normal mood and affect. Her behavior is normal. Judgment and thought content normal.       Assessment & Plan:   Problem List Items Addressed This Visit      Other   Routine general medical examination at a health care facility - Primary    1) Anticipatory Guidance: Discussed importance of wearing a seatbelt while driving and not texting while driving; changing batteries in smoke detector at least once annually; wearing suntan lotion when outside; eating a balanced and moderate diet; getting physical activity at least 30 minutes per day.  2) Immunizations / Screenings / Labs:  Do for a tetanus shot. Left office prior to receiving  tetanus shot. All other immunizations are up-to-date per recommendations. Due for a Pap smear which is scheduled for July. All other screenings are up-to-date per recommendations.Obtain CBC, CMET, A1c, Lipid profile and TSH.   Vital exam. Patient has minimal risk factors for cardiovascular disease with the exception of her BMI of 43. Established goal of 5-10% weight loss through increased physical activity and improved nutrient density. Continue other healthy lifestyle choices. Follow up prevention exam in 1 year. Follow-up office visit pending lab work.      Relevant Orders   Comprehensive metabolic panel   CBC   Lipid panel   TSH   Hemoglobin A1c

## 2015-05-22 NOTE — Telephone Encounter (Signed)
Pt aware of results 

## 2015-08-19 ENCOUNTER — Ambulatory Visit (HOSPITAL_BASED_OUTPATIENT_CLINIC_OR_DEPARTMENT_OTHER): Payer: BLUE CROSS/BLUE SHIELD | Admitting: Genetic Counselor

## 2015-08-19 ENCOUNTER — Other Ambulatory Visit: Payer: BLUE CROSS/BLUE SHIELD

## 2015-08-19 ENCOUNTER — Encounter: Payer: Self-pay | Admitting: Genetic Counselor

## 2015-08-19 DIAGNOSIS — Z803 Family history of malignant neoplasm of breast: Secondary | ICD-10-CM | POA: Diagnosis not present

## 2015-08-19 DIAGNOSIS — Z801 Family history of malignant neoplasm of trachea, bronchus and lung: Secondary | ICD-10-CM

## 2015-08-19 DIAGNOSIS — Z8042 Family history of malignant neoplasm of prostate: Secondary | ICD-10-CM

## 2015-08-19 DIAGNOSIS — Z315 Encounter for genetic counseling: Secondary | ICD-10-CM | POA: Diagnosis not present

## 2015-08-19 DIAGNOSIS — Z808 Family history of malignant neoplasm of other organs or systems: Secondary | ICD-10-CM

## 2015-08-19 NOTE — Progress Notes (Signed)
REFERRING PROVIDER: [address], [place], [place] [address]  PRIMARY PROVIDER:  Mauricio Po, FNP  PRIMARY REASON FOR VISIT:  1. Family history of breast cancer in mother   2. Family history of thyroid cancer   3. Family history of lung cancer   4. Family history of prostate cancer in father      HISTORY OF PRESENT ILLNESS:   [name], a 24 y.o. female, was seen for a [facility] cancer genetics consultation at the request of Dr. Elna Breslow due to a family history of breast and other cancers.  Becky Scott presents to clinic today to discuss the possibility of a hereditary predisposition to cancer, genetic testing, and to further clarify her future cancer risks, as well as potential cancer risks for family members.   Becky Scott is a 24 y.o. female with no personal history of cancer.  Her mother has a history of breast and thyroid cancers both diagnosed at 56.    CANCER HISTORY:   No history exists.     HORMONAL RISK FACTORS:  Menarche was at age 36.  First live birth at age 35.  OCP use for approximately pills for less than a year; implant for about 5 years years.  Ovaries intact: yes.  Hysterectomy: no.  Menopausal status: premenopausal.  HRT use: 0 years. Colonoscopy: no; not examined. Mammogram within the last year: no. Number of breast biopsies: 0. Up to date with pelvic exams:  yes. Any excessive radiation exposure in the past:  no  Past Medical History  Diagnosis Date  . No pertinent past medical history   . Trichomonas   . Asthma   . Allergy     Past Surgical History  Procedure Laterality Date  . No past surgeries      Social History   Social History  . Marital Status: Single    Spouse Name: N/A  . Number of Children: 1  . Years of Education: 12   Occupational History  . [employer]    Social History Main Topics  . Smoking status: Former Smoker -- 0.50 packs/day for .2 years    Types: Cigarettes    Quit date:  11/19/2012  . Smokeless tobacco: Never Used  . Alcohol Use: No  . Drug Use: No  . Sexual Activity: Yes    Birth Control/ Protection: Implant   Other Topics Concern  . None   Social History Narrative   Fun: Entertains her son.    Denies religious beliefs effecting health care.      FAMILY HISTORY:  We obtained a detailed, 4-generation family history.  Significant diagnoses are listed below: Family History  Problem Relation Age of Onset  . Breast cancer Mother 43    negative BRCA testing in 2010  . Thyroid cancer Mother 37    papillary type  . Prostate cancer Father     dx. 68 or younger  . Prostate cancer Paternal Grandfather     lim info  . Lung cancer Maternal Grandmother 67    was a smoker  . Other Maternal Aunt     potentially also had genetic testing  . Cervical cancer Paternal Grandmother   . Breast cancer Other     dx. 58s    Becky Scott has a 24 year old son.  She has one full brother who is currently 4.  Her mother is currently 63 and was diagnosed with breast cancer and papillary type thyroid cancer at the age  of 70.  Becky Scott called her mother while she was in the appointment today.  Her mother had negative genetic testing in 2010.  At that time she would only have had testing for BRCA1/2 genes.  Becky Scott father is currently 61 and has had prostate cancer.  Becky Scott has limited information for her father and his side of the family.  Becky Scott mother has one full sister who is currently 47 and who has not had cancer.  Becky Scott reports that this aunt had previous negative genetic testing as well.  This aunt has one daughter, age 44.  Becky Scott maternal grandmother was a smoker and passed away with lung cancer at 26.  Becky Scott reports that her maternal grandmother had two brothers and one sister, but she has limited information for these relatives.  Becky Scott grandfather is currently 54 and has not had cancer.  She reports that her  grandfather has two siblings--one brother and one sister.  She has limited information for her great uncle, but knows that her great aunt died of breast cancer in her 75s.    Becky Scott father had one full sister and one full brother.  Becky Scott has limited information for her paternal aunt and her aunt's two children, but reports that they have not had cancer.  She has no information for her paternal uncle.  Her paternal grandmother has passed away; she had a history of cervical cancer.  Her paternal grandfather passed away later in life; he also had a history of prostate cancer.  Becky Scott has no further information for this side of the family.  Patient's maternal and paternal ancestors are of Caucasian descent. There is no reported Ashkenazi Jewish ancestry. There is no known consanguinity.  GENETIC COUNSELING ASSESSMENT: Becky Scott is a 24 y.o. female with a family history of breast cancer which is somewhat suggestive of a hereditary breast cancer syndrome and predisposition to cancer. We, therefore, discussed and recommended the following at today's visit.   DISCUSSION: We reviewed the characteristics, features and inheritance patterns of hereditary cancer syndromes. We also discussed genetic testing, including the appropriate family members to test, the process of testing, insurance coverage and turn-around-time for results. We discussed that Ms. Rabbani's mother is still the best person in the family to have genetic testing.  Ms. Filler mother had negative genetic testing at a time (prior to 2013) when the only genetic testing option we had was for analyzing the BRCA1/2 genes.  We now know of additional genes that are associated with an increased risk for breast and other types of cancer that we can analyze with genetic testing.  Because her mom was diagnosed with breast and thyroid cancer at a young age, she would be an ideal candidate to begin with genetic testing.  If she tests  negative, it is unlikely that we need to worry about increased cancer risks due to a mutation in one of these genes for Ms. Alverio.  Based on the patient's personal and family history, a statistical model (the Tyrer-Cuzick risk model)  and literature data were used to estimate her risk of developing breast cancer. These estimate her lifetime risk of developing breast cancer to be approximately 18.5% to 19.0%. This estimation does not take into account any genetic testing results.  The patient's lifetime breast cancer risk is a preliminary estimate based on available information using one of several models endorsed by the [place] (ACS). The ACS recommends consideration of breast  MRI screening as an adjunct to mammography for patients at high risk (defined as 20% or greater lifetime risk). A more detailed breast cancer risk assessment can be considered, if clinically indicated.   PLAN: Based on Ms. Shan's family history, we recommended her mother, who was diagnosed with breast and thyroid cancer at age 1, have updated genetic counseling and testing. Ms. Meyerhoff will let us know if we can be of any assistance in coordinating genetic counseling and/or testing for this family member.  Additionally, Ms. Puccio mother was not yet sent a 'recall letter' for updated genetic testing, thus, we will send this letter out to her as well.  Lastly, we encouraged Ms. Madrazo to remain in contact with cancer genetics annually so that we can continuously update the family history and inform her of any changes in cancer genetics and testing that may be of benefit for this family.   Ms.  Avants questions were answered to her satisfaction today. Our contact information was provided should additional questions or concerns arise. Thank you for the referral and allowing [place] to share in the care of your patient.   Jeanine Luz, MS Genetic Counselor [email or web address] phone: [phone]  The  patient was seen for a total of 40 minutes in face-to-face genetic counseling.  This patient was discussed with Drs. Magrinat, Lindi Adie and/or Burr Medico who agrees with the above.    _______________________________________________________________________ For Office Staff:  Number of people involved in session: 1 Was an Intern/ student involved with case: no

## 2015-08-21 ENCOUNTER — Ambulatory Visit: Payer: BLUE CROSS/BLUE SHIELD | Admitting: Dietician

## 2015-09-25 ENCOUNTER — Encounter (HOSPITAL_COMMUNITY): Payer: Self-pay

## 2016-03-29 ENCOUNTER — Telehealth: Payer: Self-pay | Admitting: Internal Medicine

## 2016-03-29 MED ORDER — MOMETASONE FURO-FORMOTEROL FUM 200-5 MCG/ACT IN AERO: 2.0000 | INHALATION_SPRAY | Freq: Two times a day (BID) | RESPIRATORY_TRACT | Status: DC

## 2016-03-29 NOTE — Telephone Encounter (Signed)
Spoke with pt. She needs a sample of Dulera. OV has been scheduled with MW on 04/05/16 at 2:15pm. Sample has been left at the front desk for pick up. Nothing further was needed.

## 2016-03-30 ENCOUNTER — Encounter: Payer: Self-pay | Admitting: Internal Medicine

## 2016-03-30 ENCOUNTER — Ambulatory Visit (INDEPENDENT_AMBULATORY_CARE_PROVIDER_SITE_OTHER): Payer: BLUE CROSS/BLUE SHIELD | Admitting: Internal Medicine

## 2016-03-30 VITALS — BP 122/76 | HR 64 | Ht 63.0 in | Wt 252.0 lb

## 2016-03-30 DIAGNOSIS — J4520 Mild intermittent asthma, uncomplicated: Secondary | ICD-10-CM | POA: Diagnosis not present

## 2016-03-30 DIAGNOSIS — Z6841 Body Mass Index (BMI) 40.0 and over, adult: Secondary | ICD-10-CM | POA: Insufficient documentation

## 2016-03-30 MED ORDER — BUDESONIDE-FORMOTEROL FUMARATE 160-4.5 MCG/ACT IN AERO: INHALATION_SPRAY | RESPIRATORY_TRACT | Status: DC

## 2016-03-30 NOTE — Assessment & Plan Note (Addendum)
-   03/30/2016  p extensive coaching HFA effectiveness =    90%  DDX of  difficult airways management almost all start with A and  include Adherence, Ace Inhibitors, Acid Reflux, Active Sinus Disease, Alpha 1 Antitripsin deficiency, Anxiety masquerading as Airways dz,  ABPA,  Allergy(esp in young), Aspiration (esp in elderly), Adverse effects of meds,  Active smokers, A bunch of PE's (a small clot burden can't cause this syndrome unless there is already severe underlying pulm or vascular dz with poor reserve) plus two Bs  = Bronchiectasis and Beta blocker use..and one C= CHF     Adherence is always the initial "prime suspect" and is a multilayered concern that requires a "trust but verify" approach in every patient - starting with knowing how to use medications, especially inhalers, correctly, keeping up with refills and understanding the fundamental difference between maintenance and prns vs those medications only taken for a very short course and then stopped and not refilled.  -- The proper method of use, as well as anticipated side effects, of a metered-dose inhaler are discussed and demonstrated to the patient. Improved effectiveness after extensive coaching during this visit to a level of approximately 90 % from a baseline of 75 %   ? Allergy > use high dose ICS = symbicort 160 and out thru nose plus  clariton with option of change to zyrtec or add singulair if not controlling seasonally  ? Acid (or non-acid) GERD > always difficult to exclude as up to 75% of pts in some series report no assoc GI/ Heartburn symptoms>  Consider also adding  max (24h)  acid suppression and diet restrictions esp if noct symptoms dominate the clinical picture  I had an extended discussion with the patient reviewing all relevant studies completed to date and  lasting 15 to 20 minutes of a 25 minute visit    Each maintenance medication was reviewed in detail including most importantly the difference between maintenance and  prns and under what circumstances the prns are to be triggered using an action plan format that is not reflected in the computer generated alphabetically organized AVS.    Please see instructions for details which were reviewed in writing and the patient given a copy highlighting the part that I personally wrote and discussed at today's ov.

## 2016-03-30 NOTE — Progress Notes (Signed)
Subjective:    Patient ID: Becky Scott, female    DOB: Feb 07, 1991 MRN: [id number]    Brief patient profile:  25  yowf problems as infant with asthma always needed inhalers esp in springtime started smoking as teenager made it worse so quit Dec 2013 then again  worse in spring 2014 > ER > refer 05/03/2013 to pulmonary clinic.   History of Present Illness  05/03/2013 1st pulmonary ov/ cc chest tightness, sob worse when lie down which makes her cough> to ER 5/9 and better after more albuterol but continues to use it up to every 3 hours and did so w/in 2 h of ov. Symptoms occure 24/7 but esp at hs, cough mostly dry. > pred taper , dulera rx     05/17/2013 Follow up  2 week follow up asthma - reports breathing is doing well on the dulera and she has not had to use SABA since last ov. Feeling much better.  No wheezing , cough or nocturnal awakening.  Tolerating Dulera well.  Needs rx sent to pharm.  rec Continue on Dulera 200 Take 2 puffs first thing in am and then another 2 puffs about 12 hours later.  Only use your albuterol (proaire) as rescue   04/03/2015 f/u ov/Wert re: intermittent asthma / not on maint rx  Chief Complaint  Patient presents with  . Follow-up    Pt c/o increased SOB for the past few wks. She states that she usually only has problem this time of year. [facility] now and does not have rescue inhaler. She also c/o cough- non prod and mainly bothers her at night.   variable sob/cough esp with spring season, rest of the year s doe/ coughing Not convinced saba works but had no problem on dulera 200  Worse x one month first thing in am and before bed does not typically wake her up though   rec Resume dulera 200 Take 2 puffs first thing in am and then another 2 puffs about 12 hours later x 2 weeks then as needed thereafter> resolved     03/30/2016  f/u ov/Wert re: seasonal asthma  Chief Complaint  Patient presents with  . Follow-up    Pt  c/o increased SOB, mainly at night for the past few days- relates to allergies.   onset gradually worse x  2 weeks =chest tightness starts to act up after supper and esp x 2 days  hs and freq noct awakening has to sit up to sleep chest tight /sob     No other obvious day to day or daytime variabilty or assoc excess/ purulent sputum or mucus plugs   or cp or chest tightness, subjective wheeze overt sinus or hb symptoms. No unusual exp hx or h/o childhood pna/   or knowledge of premature birth.    Also denies any obvious fluctuation of symptoms with weather or environmental changes or other aggravating or alleviating factors except as outlined above   Current Medications, Allergies, Complete Past Medical History, Past Surgical History, Family History, and Social History were reviewed in [facility] electronic medical record.  ROS  The following are not active complaints unless bolded sore throat, dysphagia, dental problems, itching, sneezing better on clariton,  nasal congestion or excess/ purulent secretions, ear ache,   fever, chills, sweats, unintended wt loss, pleuritic or exertional cp, hemoptysis,  orthopnea pnd or leg swelling, presyncope, palpitations, heartburn, abdominal pain, anorexia, nausea, vomiting, diarrhea  or  change in bowel or urinary habits, change in stools or urine, dysuria,hematuria,  rash, arthralgias, visual complaints, headache, numbness weakness or ataxia or problems with walking or coordination,  change in mood/affect or memory.            Objective:   Physical Exam  amb wf nad    03/30/2016       252   04/03/15 252 lb (114.306 kg)  05/17/13 224 lb 12.8 oz (101.969 kg)  05/03/13 226 lb (102.513 kg)    Vital signs reviewed    HEENT: nl dentition, turbinates,   Nl external ear canals without cough reflex- moderate bilateral non-specific turbinate edema     NECK :  without JVD/Nodes/TM/ nl carotid upstrokes bilaterally   LUNGS: no acc muscle use, clear to  A and P bilaterally without cough on insp or exp maneuvers   CV:  RRR  no s3 or murmur or increase in P2, no edema   ABD:  soft and nontender with nl excursion in the supine position. No bruits or organomegaly, bowel sounds nl  MS:  warm without deformities, calf tenderness, cyanosis or clubbing  SKIN: warm and dry without lesions    NEURO:  alert, approp, no deficits            Assessment & Plan:

## 2016-03-30 NOTE — Patient Instructions (Addendum)
Start symbicort 160 Take 2 puffs first thing in am and then another 2 puffs about 12 hours later until 100% better x 2 weeks then as needed    If you are satisfied with your treatment plan,  let your doctor know and he/she can either refill your medications or you can return here when your prescription runs out.     If in any way you are not 100% satisfied,  please tell us.  If 100% better, tell your friends!  Pulmonary follow up is as needed

## 2016-03-30 NOTE — Assessment & Plan Note (Signed)
Body mass index is 44.65   Lab Results  Component Value Date   TSH 1.54 05/21/2015     Contributing to gerd tendency/ doe/reviewed the need and the process to achieve and maintain neg calorie balance > defer f/u primary care including intermittently monitoring thyroid status

## 2016-04-05 ENCOUNTER — Ambulatory Visit: Payer: BLUE CROSS/BLUE SHIELD | Admitting: Internal Medicine

## 2016-04-29 ENCOUNTER — Ambulatory Visit: Payer: BLUE CROSS/BLUE SHIELD | Admitting: Family

## 2017-05-02 ENCOUNTER — Other Ambulatory Visit: Payer: Self-pay | Admitting: Internal Medicine

## 2017-10-28 ENCOUNTER — Emergency Department (HOSPITAL_COMMUNITY): Payer: 59

## 2017-10-28 ENCOUNTER — Emergency Department (HOSPITAL_COMMUNITY)
Admission: EM | Admit: 2017-10-28 | Discharge: 2017-10-28 | Disposition: A | Discharge status: Home / Self Care | Payer: 59 | Attending: Emergency Medicine | Admitting: Emergency Medicine

## 2017-10-28 ENCOUNTER — Encounter (HOSPITAL_COMMUNITY): Payer: Self-pay | Admitting: Emergency Medicine

## 2017-10-28 DIAGNOSIS — F17210 Nicotine dependence, cigarettes, uncomplicated: Secondary | ICD-10-CM | POA: Diagnosis not present

## 2017-10-28 DIAGNOSIS — J45909 Unspecified asthma, uncomplicated: Secondary | ICD-10-CM | POA: Insufficient documentation

## 2017-10-28 DIAGNOSIS — R55 Syncope and collapse: Secondary | ICD-10-CM | POA: Diagnosis not present

## 2017-10-28 DIAGNOSIS — Z79899 Other long term (current) drug therapy: Secondary | ICD-10-CM | POA: Insufficient documentation

## 2017-10-28 DIAGNOSIS — I509 Heart failure, unspecified: Secondary | ICD-10-CM | POA: Insufficient documentation

## 2017-10-28 DIAGNOSIS — I110 Hypertensive heart disease with heart failure: Secondary | ICD-10-CM | POA: Diagnosis not present

## 2017-10-28 DIAGNOSIS — R42 Dizziness and giddiness: Secondary | ICD-10-CM | POA: Diagnosis present

## 2017-10-28 LAB — BASIC METABOLIC PANEL
Anion gap: 7 (ref 5–15)
BUN: 6 mg/dL (ref 6–20)
CHLORIDE: 106 mmol/L (ref 101–111)
CO2: 26 mmol/L (ref 22–32)
Calcium: 9 mg/dL (ref 8.9–10.3)
Creatinine, Ser: 0.72 mg/dL (ref 0.44–1.00)
GFR calc Af Amer: >60 mL/min (ref >60)
GFR calc non Af Amer: >60 mL/min (ref >60)
GLUCOSE: 91 mg/dL (ref 65–99)
Potassium: 4.5 mmol/L (ref 3.5–5.1)
Sodium: 139 mmol/L (ref 135–145)

## 2017-10-28 LAB — CBC
HCT: 41.8 % (ref 36.0–46.0)
Hemoglobin: 13.6 g/dL (ref 12.0–15.0)
MCH: 28.5 pg (ref 26.0–34.0)
MCHC: 32.5 g/dL (ref 30.0–36.0)
MCV: 87.6 fL (ref 78.0–100.0)
Platelets: 242 10*3/uL (ref 150–400)
RBC: 4.77 MIL/uL (ref 3.87–5.11)
RDW: 12.4 % (ref 11.5–15.5)
WBC: 7.8 10*3/uL (ref 4.0–10.5)

## 2017-10-28 LAB — I-STAT TROPONIN, ED
Troponin i, poc: 0 ng/mL (ref 0.00–0.08)
Troponin i, poc: 0 ng/mL (ref 0.00–0.08)

## 2017-10-28 MED ORDER — SODIUM CHLORIDE 0.9 % IV BOLUS (SEPSIS)
1000.0000 mL | Freq: Once | INTRAVENOUS | Status: AC
  Administered 2017-10-28: 1000 mL via INTRAVENOUS

## 2017-10-28 NOTE — ED Notes (Signed)
Ambulated patient with pulse oximetry. Patient began at 96% and Hr 68. After walking oxygen 98% and HR 90

## 2017-10-28 NOTE — ED Triage Notes (Signed)
Pt was at work when she suddenly started having chest palpations and nausea with sweats. Pt denies any cp or sob. Pt has numbness in both legs. Pt was ambulatory at triage.

## 2017-10-28 NOTE — Discharge Instructions (Signed)
Please call cardiology to schedule follow-up appointment.  You may need to wear a Holter monitor.  Your workup today did not reveal a clear cause of your symptoms.  Please continue to drink plenty of fluids and stay hydrated while at work.  If you develop new or concerning symptoms, including additional episodes of dizziness and lightheadedness, feeling like your heart is beating out of your chest, chest pain, or shortness of breath, please return to the emergency department for reevaluation.

## 2017-10-28 NOTE — ED Notes (Signed)
Walked patient to the bathroom patient did well 

## 2017-10-28 NOTE — ED Notes (Signed)
Pt was ambulated while on pulse oximetry. Pt's O2 sats remained between 95-98, and her HR between 85-90. Pt had steady gait and stated that she felt better.

## 2017-10-28 NOTE — ED Provider Notes (Signed)
[facility] EMERGENCY DEPARTMENT Provider Note   CSN: [id number] Arrival date & time: 10/28/17  0907     History   Chief Complaint Chief Complaint  Patient presents with  . Palpitations    HPI Becky Scott is a 26 y.o. female presents to the emergency department with a chief complaint of 2 episodes of lightheadedness, dizziness, diaphoresis, and heart palpitations that occurred this morning while she was at work.  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  She reports the second episode happened when she was leaving work and walking out to her car on the way to the hospital.  She reports that this lasted for couple minutes and resolved after sitting down.  No history of similar.  She denies chest pain, shortness of breath, fever, chills, nausea, vomiting, back pain, or leg swelling. No treatment PTA.   The history is provided by the patient. No language interpreter was used.    Past Medical History:  Diagnosis Date  . Allergy   . Asthma   . CHF (congestive heart failure) ([place])   . Hypertension   . No pertinent past medical history   . Trichomonas     Patient Active Problem List   Diagnosis Date Noted  . Severe obesity (BMI >= 40) ([place]) 03/30/2016  . Family history of breast cancer in mother 08/19/2015  . Family history of thyroid cancer 08/19/2015  . Routine general medical examination at a health care facility 05/21/2015  . Mild intermittent asthma without complication 84/69/6295    Past Surgical History:  Procedure Laterality Date  . NO PAST SURGERIES      OB History    Gravida Para Term Preterm AB Living   '2 1 1   1 1   ' SAB TAB Ectopic Multiple Live Births   1       1       Home Medications    Prior to Admission medications   Medication Sig Start Date End Date Taking? Authorizing Provider    acetaminophen (TYLENOL) 325 MG tablet Take 650 mg every 6 (six) hours as needed by mouth for headache.   Yes [provider]  budesonide-formoterol (SYMBICORT) 160-4.5 MCG/ACT inhaler Take 2 puffs first thing in am and then another 2 puffs about 12 hours later. 03/30/16   Tanda Rockers, MD  loratadine (CLARITIN) 10 MG tablet Take 10 mg by mouth daily as needed for allergies.    [provider]    Family History Family History  Problem Relation Age of Onset  . Breast cancer Mother 79       negative BRCA testing in 2010  . Thyroid cancer Mother 10       papillary type  . Prostate cancer Father        dx. 20 or younger  . Prostate cancer Paternal Grandfather        lim info  . Lung cancer Maternal Grandmother 67       was a smoker  . Other Maternal Aunt        potentially also had genetic testing  . Cervical cancer Paternal Grandmother   . Breast cancer Other        dx. 53s    Social History Social History   Tobacco Use  . Smoking status: Current Every Day Smoker    Packs/day: 0.50    Years:  2.00    Pack years: 1.00    Types: Cigarettes  . Smokeless tobacco: Never Used  Substance Use Topics  . Alcohol use: No    Alcohol/week: 0.0 oz  . Drug use: No     Allergies   Patient has no known allergies.   Review of Systems Review of Systems  Constitutional: Positive for diaphoresis. Negative for chills and fever.  Respiratory: Negative for apnea, cough, choking, chest tightness, shortness of breath, wheezing and stridor.   Cardiovascular: Positive for palpitations. Negative for chest pain and leg swelling.  Gastrointestinal: Negative for diarrhea, nausea and vomiting.  Musculoskeletal: Negative for neck pain.  Skin: Negative for wound.  Neurological: Positive for dizziness and light-headedness. Negative for seizures, syncope, weakness and numbness.     Physical Exam Updated Vital Signs BP 122/88   Pulse 74   Temp 98 F (36.7 C) (Oral)   Resp  13   LMP  (Approximate) Comment: unknown exact date, thinks mid Oct for LMP  SpO2 100%   Physical Exam  Constitutional: She is oriented to person, place, and time. No distress.  HENT:  Head: Normocephalic.  Eyes: Conjunctivae and EOM are normal. Pupils are equal, round, and reactive to light.  Neck: Neck supple.  Cardiovascular: Normal rate, regular rhythm, normal heart sounds and intact distal pulses. Exam reveals no gallop and no friction rub.  No murmur heard. Pulmonary/Chest: Effort normal and breath sounds normal. No stridor. No respiratory distress. She has no wheezes. She has no rales. She exhibits no tenderness.  Abdominal: Soft. She exhibits no distension.  Neurological: She is alert and oriented to person, place, and time.  Cranial nerves 2-12 intact. Finger-to-nose is normal. 5/5 motor strength of the bilateral upper and lower extremities. Moves all four extremities. Negative Romberg. Ambulatory without difficulty. NVI.    Skin: Skin is warm. No rash noted.  Psychiatric: Her behavior is normal.  Nursing note and vitals reviewed.    ED Treatments / Results  Labs (all labs ordered are listed, but only abnormal results are displayed) Labs Reviewed  BASIC METABOLIC PANEL  CBC  I-STAT TROPONIN, ED  I-STAT TROPONIN, ED    EKG  EKG Interpretation  Date/Time:  Friday October 28 2017 09:19:05 EST Ventricular Rate:  79 PR Interval:  126 QRS Duration: 94 QT Interval:  380 QTC Calculation: 435 R Axis:   79 Text Interpretation:  Normal sinus rhythm Incomplete right bundle branch block Septal infarct , age undetermined Abnormal ECG no significant change since 2007 Confirmed by Sherwood Gambler [phone]) on 10/28/2017 11:23:55 AM       Radiology Dg Chest 2 View  Result Date: 10/28/2017 CLINICAL DATA:  Tachycardia and dizziness EXAM: CHEST  2 VIEW COMPARISON:  04/27/2013 FINDINGS: The heart size and mediastinal contours are within normal limits. Both lungs are clear. The  visualized skeletal structures are unremarkable. IMPRESSION: No active cardiopulmonary disease. Electronically Signed   By: Inez Catalina M.D.   On: 10/28/2017 10:00    Procedures Procedures (including critical care time)  Medications Ordered in ED Medications  sodium chloride 0.9 % bolus 1,000 mL (0 mLs Intravenous Stopped 10/28/17 1609)     Initial Impression / Assessment and Plan / ED Course  I have reviewed the triage vital signs and the nursing notes.  Pertinent labs & imaging results that were available during my care of the patient were reviewed by me and considered in my medical decision making (see chart for details).     26 y.o. presenting  with 2 episodes of palpitations, lightheadedness, dizziness, and diaphoresis that began this AM. EKG with no changes from previous. Labs are unremarkable. Patient is to be discharged with recommendation to follow up with PCP or cardiology in regards to today's hospital visit. Chest pain is not likely of cardiac or pulmonary etiology d/t presentation, PERC negative, VSS, no tracheal deviation, no JVD or new murmur, RRR, breath sounds equal bilaterally, EKG without acute abnormalities, negative troponin, and negative CXR. IVF bolus given .The patient was given a liter of IVF and ambulated on pulse ox without additional assistance. Pt has been advised to return to the ED if CP becomes exertional, associated with diaphoresis or nausea, radiates to left jaw/arm, worsens or becomes concerning in any way. Pt appears reliable for follow up and is agreeable to discharge.       Final Clinical Impressions(s) / ED Diagnoses   Final diagnoses:  Near syncope    ED Discharge Orders    None       Joanne Gavel, PA-C 10/29/17 0124    Sherwood Gambler, MD 10/29/17 2003

## 2017-10-28 NOTE — ED Notes (Signed)
Pt states she was d/c from hospital about a week ago with sob and chf htn state3s she has felt stressed at work  Having cp and sob

## 2018-03-28 DIAGNOSIS — R87619 Unspecified abnormal cytological findings in specimens from cervix uteri: Secondary | ICD-10-CM | POA: Insufficient documentation

## 2018-03-30 ENCOUNTER — Telehealth: Payer: Self-pay | Admitting: Genetics

## 2018-03-30 NOTE — Telephone Encounter (Signed)
Appointments scheduled referring office and patient has been notified date/time/location/ phone number

## 2018-05-08 ENCOUNTER — Encounter: Payer: 59 | Admitting: Genetic Counselor

## 2018-05-08 ENCOUNTER — Other Ambulatory Visit: Payer: 59

## 2018-05-09 ENCOUNTER — Inpatient Hospital Stay: Payer: 59 | Attending: Genetic Counselor | Admitting: Genetics

## 2018-05-09 ENCOUNTER — Inpatient Hospital Stay: Payer: 59

## 2018-07-03 NOTE — Progress Notes (Deleted)
@Patient  ID: Becky Scott, female    DOB: 1991-05-27, 27 y.o.   MRN: [id number]  No chief complaint on file.   Referring provider: Veryl Speak, FNP  HPI: 27  yowf problems as infant with asthma always needed inhalers esp in springtime started smoking as teenager made it worse so quit Dec 2013 then again  worse in spring 2014 > ER > refer 05/03/2013 to pulmonary clinic. Patient of Dr. Sherene Sires  Recent [facility] Encounters:   03/30/2016  f/u ov/Wert re: seasonal asthma  Chief Complaint  Patient presents with  . Follow-up    Pt c/o increased SOB, mainly at night for the past few days- relates to allergies.   onset gradually worse x  2 weeks =chest tightness starts to act up after supper and esp x 2 days  hs and freq noct awakening has to sit up to sleep chest tight /sob      Tests:   10/28/2017-chest x-ray-no active cardiopulmonary disease, both lungs are clear  Chart Review:  No follow-up with pulmonary for greater than 2 years No follow-up with primary care seen in chart review Last ED visit was in November/2018 for near syncope     07/04/18 OV      No Known Allergies  There is no immunization history for the selected administration types on file for this patient.  Past Medical History:  Diagnosis Date  . Allergy   . Asthma   . CHF (congestive heart failure) ([facility])   . Hypertension   . No pertinent past medical history   . Trichomonas     Tobacco History: Social History   Tobacco Use  Smoking Status Current Every Day Smoker  . Packs/day: 0.50  . Years: 2.00  . Pack years: 1.00  . Types: Cigarettes  Smokeless Tobacco Never Used   Ready to quit: Not Answered Counseling given: Not Answered   Outpatient Encounter Medications as of 07/04/2018  Medication Sig  . acetaminophen (TYLENOL) 325 MG tablet Take 650 mg every 6 (six) hours as needed by mouth for headache.  . budesonide-formoterol (SYMBICORT) 160-4.5 MCG/ACT inhaler Take 2 puffs first  thing in am and then another 2 puffs about 12 hours later.  . loratadine (CLARITIN) 10 MG tablet Take 10 mg by mouth daily as needed for allergies.   No facility-administered encounter medications on file as of 07/04/2018.      Review of Systems  Review of Systems    Constitutional:   No  weight loss, night sweats,  fevers, chills, fatigue, or  lassitude HEENT:   No headaches,  Difficulty swallowing,  Tooth/dental problems, or  Sore throat, No sneezing, itching, ear ache, nasal congestion, post nasal drip  CV: No chest pain,  orthopnea, PND, swelling in lower extremities, anasarca, dizziness, palpitations, syncope  GI: No heartburn, indigestion, abdominal pain, nausea, vomiting, diarrhea, change in bowel habits, loss of appetite, bloody stools Resp: No shortness of breath with exertion or at rest.  No excess mucus, no productive cough,  No non-productive cough,  No coughing up of blood.  No change in color of mucus.  No wheezing.  No chest wall deformity Skin: no rash, lesions, no skin changes. GU: no dysuria, change in color of urine, no urgency or frequency.  No flank pain, no hematuria  MS:  No joint pain or swelling.  No decreased range of motion.  No back pain. Psych:  No change in mood or affect. No depression or anxiety.  No memory loss.  Physical Exam  There were no vitals taken for this visit.  Wt Readings from Last 5 Encounters:  03/30/16 252 lb (114.3 kg)  05/21/15 247 lb 12.8 oz (112.4 kg)  04/03/15 252 lb (114.3 kg)  05/17/13 224 lb 12.8 oz (102 kg)  05/03/13 226 lb (102.5 kg)     Physical Exam    GEN: A/Ox3; pleasant , NAD, well nourished, appears stated age HEENT:  [place]/AT,  EACs-clear, TMs-wnl, NOSE-clear, THROAT-clear, no lesions, no postnasal drip or exudate noted.  NECK:  Supple w/ fair ROM; no JVD; normal carotid impulses w/o bruits; no thyromegaly or nodules palpated; no lymphadenopathy.   RESP  Clear  P & A; w/o, wheezes/ rales/ or rhonchi. no  accessory muscle use, no dullness to percussion CARD:  RRR, no m/r/g, no peripheral edema, pulses intact: radial 2+ bilaterally, DP 2+ bilaterally, no cyanosis or clubbing. GI:   Soft & nt; nml bowel sounds; no organomegaly or masses detected.  Musco: Warm bilaterally, no deformities or joint swelling noted.  Neuro: alert, no focal deficits noted.   Skin: Warm, no lesions or rashes     Lab Results:  CBC    Component Value Date/Time   WBC 7.8 10/28/2017 0931   RBC 4.77 10/28/2017 0931   HGB 13.6 10/28/2017 0931   HCT 41.8 10/28/2017 0931   PLT 242 10/28/2017 0931   MCV 87.6 10/28/2017 0931   MCH 28.5 10/28/2017 0931   MCHC 32.5 10/28/2017 0931   RDW 12.4 10/28/2017 0931    BMET    Component Value Date/Time   NA 139 10/28/2017 0931   K 4.5 10/28/2017 0931   CL 106 10/28/2017 0931   CO2 26 10/28/2017 0931   GLUCOSE 91 10/28/2017 0931   BUN 6 10/28/2017 0931   CREATININE 0.72 10/28/2017 0931   CALCIUM 9.0 10/28/2017 0931   GFRNONAA >60 10/28/2017 0931   GFRAA >60 10/28/2017 0931    BNP No results found for: BNP  ProBNP No results found for: PROBNP  Imaging: No results found.   Assessment & Plan:   No problem-specific Assessment & Plan notes found for this encounter.     Coral CeoBrian P Mack, NP 07/03/2018

## 2018-07-04 ENCOUNTER — Ambulatory Visit: Payer: 59 | Admitting: Pulmonary Disease

## 2019-02-12 ENCOUNTER — Ambulatory Visit (HOSPITAL_COMMUNITY)
Admission: EM | Admit: 2019-02-12 | Discharge: 2019-02-12 | Disposition: A | Discharge status: Home / Self Care | Payer: 59 | Attending: Internal Medicine | Admitting: Internal Medicine

## 2019-02-12 ENCOUNTER — Other Ambulatory Visit: Payer: Self-pay

## 2019-02-12 ENCOUNTER — Encounter (HOSPITAL_COMMUNITY): Payer: Self-pay | Admitting: Emergency Medicine

## 2019-02-12 DIAGNOSIS — B349 Viral infection, unspecified: Secondary | ICD-10-CM | POA: Insufficient documentation

## 2019-02-12 LAB — POCT RAPID STREP A: Streptococcus, Group A Screen (Direct): NEGATIVE

## 2019-02-12 MED ORDER — ACETAMINOPHEN 325 MG PO TABS
650.0000 mg | ORAL_TABLET | Freq: Once | ORAL | Status: AC
  Administered 2019-02-12: 650 mg via ORAL

## 2019-02-12 NOTE — ED Provider Notes (Signed)
[name]    CSN: [id number] Arrival date & time: 02/12/19  0806     History   Chief Complaint Chief Complaint  Patient presents with  . URI    HPI Becky Scott is a 28 y.o. female.   Patient is a 28 year old female with approximate 5 days of headache, body aches, sore throat, rhinorrhea, intermittent dry cough.  Symptoms have been constant and remain the same since 5 days ago.  She has been using Alka-Seltzer cold and flu medication without much relief of symptoms.  Unsure of any recent sick contacts but she does work with the public.  Denies any recent traveling.  Denies any nausea, vomiting, diarrhea.  Current fever here in clinic today of 100.4.  ROS per HPI     Past Medical History:  Diagnosis Date  . Allergy   . Asthma   . CHF (congestive heart failure) ([place])   . Hypertension   . No pertinent past medical history   . Trichomonas     Patient Active Problem List   Diagnosis Date Noted  . Severe obesity (BMI >= 40) ([place]) 03/30/2016  . Family history of breast cancer in mother 08/19/2015  . Family history of thyroid cancer 08/19/2015  . Routine general medical examination at a health care facility 05/21/2015  . Mild intermittent asthma without complication 94/70/9628    Past Surgical History:  Procedure Laterality Date  . NO PAST SURGERIES      OB History    Gravida  2   Para  1   Term  1   Preterm      AB  1   Living  1     SAB  1   TAB      Ectopic      Multiple      Live Births  1            Home Medications    Prior to Admission medications   Medication Sig Start Date End Date Taking? Authorizing Provider  acetaminophen (TYLENOL) 325 MG tablet Take 650 mg every 6 (six) hours as needed by mouth for headache.    [provider]  budesonide-formoterol (SYMBICORT) 160-4.5 MCG/ACT inhaler Take 2 puffs first thing in am and then another 2 puffs about 12 hours later. 03/30/16   Tanda Rockers, MD  loratadine  (CLARITIN) 10 MG tablet Take 10 mg by mouth daily as needed for allergies.    [provider]    Family History Family History  Problem Relation Age of Onset  . Breast cancer Mother 84       negative BRCA testing in 2010  . Thyroid cancer Mother 69       papillary type  . Prostate cancer Father        dx. 77 or younger  . Prostate cancer Paternal Grandfather        lim info  . Lung cancer Maternal Grandmother 67       was a smoker  . Other Maternal Aunt        potentially also had genetic testing  . Cervical cancer Paternal Grandmother   . Breast cancer Other        dx. 24s    Social History Social History   Tobacco Use  . Smoking status: Current Every Day Smoker    Packs/day: 0.50    Years: 2.00    Pack years: 1.00    Types: Cigarettes  . Smokeless tobacco: Never Used  Substance Use Topics  . Alcohol use: No    Alcohol/week: 0.0 standard drinks  . Drug use: No     Allergies   Patient has no known allergies.   Review of Systems Review of Systems   Physical Exam Triage Vital Signs ED Triage Vitals  Enc Vitals Group     BP 02/12/19 0856 127/73     Pulse Rate 02/12/19 0856 84     Resp 02/12/19 0856 19     Temp 02/12/19 0856 (!) 100.4 F (38 C)     Temp Source 02/12/19 0856 Oral     SpO2 02/12/19 0856 100 %     Weight --      Height --      Head Circumference --      Peak Flow --      Pain Score 02/12/19 0854 8     Pain Loc --      Pain Edu? --      Excl. in [place]? --    No data found.  Updated Vital Signs BP 127/73 (BP Location: Left Arm) Comment (BP Location): large cuff  Pulse 84   Temp (!) 100.4 F (38 C) (Oral)   Resp 19   LMP 02/09/2019   SpO2 100%   Visual Acuity Right Eye Distance:   Left Eye Distance:   Bilateral Distance:    Right Eye Near:   Left Eye Near:    Bilateral Near:     Physical Exam Constitutional:      Appearance: Normal appearance. She is normal weight. She is not ill-appearing.  HENT:     Head:  Normocephalic and atraumatic.     Right Ear: Tympanic membrane and ear canal normal.     Left Ear: Tympanic membrane and ear canal normal.     Nose: Congestion present.     Mouth/Throat:     Pharynx: Oropharynx is clear.  Eyes:     Conjunctiva/sclera: Conjunctivae normal.  Neck:     Musculoskeletal: Normal range of motion.  Cardiovascular:     Rate and Rhythm: Normal rate and regular rhythm.     Pulses: Normal pulses.     Heart sounds: Normal heart sounds.  Pulmonary:     Effort: Pulmonary effort is normal.     Breath sounds: Normal breath sounds.  Musculoskeletal: Normal range of motion.  Skin:    General: Skin is warm and dry.  Neurological:     Mental Status: She is alert.  Psychiatric:        Mood and Affect: Mood normal.      UC Treatments / Results  Labs (all labs ordered are listed, but only abnormal results are displayed) Labs Reviewed  CULTURE, GROUP A STREP [facility])  POCT RAPID STREP A    EKG None  Radiology No results found.  Procedures Procedures (including critical care time)  Medications Ordered in UC Medications  acetaminophen (TYLENOL) tablet 650 mg (650 mg Oral Given 02/12/19 0904)    Initial Impression / Assessment and Plan / UC Course  I have reviewed the triage vital signs and the nursing notes.  Pertinent labs & imaging results that were available during my care of the patient were reviewed by me and considered in my medical decision making (see chart for details).    Rapid strep test negative.  Will send for culture. Symptoms consistent with flu like illness Symptomatic treatment with OTC cough and congestion medication such as mucinex Tylenol/ibuprofen for the myalgias and fever.  Follow  up as needed for continued or worsening symptoms  Final Clinical Impressions(s) / UC Diagnoses   Final diagnoses:  Viral illness     Discharge Instructions     Rapid strep test negative This is most likely a viral upper respiratory  infection Tylenol/ibuprofen for fever, body aches Mucinex for cough, congestion Follow up as needed for continued or worsening symptoms     ED Prescriptions    None     Controlled Substance Prescriptions [place] Controlled Substance Registry consulted? Not Applicable   Orvan July, NP 02/12/19 [phone]

## 2019-02-12 NOTE — ED Triage Notes (Signed)
Onset 5 days ago.  Patient had headache, body aches, now sore throat, slight runny nose, intermittent cough

## 2019-02-12 NOTE — Discharge Instructions (Signed)
Rapid strep test negative This is most likely a viral upper respiratory infection Tylenol/ibuprofen for fever, body aches Mucinex for cough, congestion Follow up as needed for continued or worsening symptoms

## 2019-02-14 ENCOUNTER — Emergency Department (HOSPITAL_COMMUNITY)
Admission: EM | Admit: 2019-02-14 | Discharge: 2019-02-14 | Disposition: A | Discharge status: Home / Self Care | Payer: 59 | Attending: Emergency Medicine | Admitting: Emergency Medicine

## 2019-02-14 ENCOUNTER — Emergency Department (HOSPITAL_COMMUNITY): Payer: 59

## 2019-02-14 ENCOUNTER — Other Ambulatory Visit: Payer: Self-pay

## 2019-02-14 ENCOUNTER — Encounter (HOSPITAL_COMMUNITY): Payer: Self-pay

## 2019-02-14 DIAGNOSIS — J45909 Unspecified asthma, uncomplicated: Secondary | ICD-10-CM | POA: Diagnosis not present

## 2019-02-14 DIAGNOSIS — Z79899 Other long term (current) drug therapy: Secondary | ICD-10-CM | POA: Diagnosis not present

## 2019-02-14 DIAGNOSIS — F17210 Nicotine dependence, cigarettes, uncomplicated: Secondary | ICD-10-CM | POA: Diagnosis not present

## 2019-02-14 DIAGNOSIS — B349 Viral infection, unspecified: Secondary | ICD-10-CM | POA: Insufficient documentation

## 2019-02-14 DIAGNOSIS — I10 Essential (primary) hypertension: Secondary | ICD-10-CM | POA: Insufficient documentation

## 2019-02-14 DIAGNOSIS — J029 Acute pharyngitis, unspecified: Secondary | ICD-10-CM | POA: Diagnosis present

## 2019-02-14 LAB — CULTURE, GROUP A STREP (THRC)

## 2019-02-14 LAB — MONONUCLEOSIS SCREEN: Mono Screen: NEGATIVE

## 2019-02-14 LAB — INFLUENZA PANEL BY PCR (TYPE A & B)
Influenza A By PCR: NEGATIVE
Influenza B By PCR: NEGATIVE

## 2019-02-14 LAB — GROUP A STREP BY PCR: Group A Strep by PCR: NOT DETECTED

## 2019-02-14 MED ORDER — IBUPROFEN 400 MG PO TABS
400.0000 mg | ORAL_TABLET | Freq: Once | ORAL | Status: AC
  Administered 2019-02-14: 400 mg via ORAL
  Filled 2019-02-14: qty 1

## 2019-02-14 MED ORDER — ACETAMINOPHEN 325 MG PO TABS
650.0000 mg | ORAL_TABLET | Freq: Once | ORAL | Status: AC
  Administered 2019-02-14: 650 mg via ORAL
  Filled 2019-02-14: qty 2

## 2019-02-14 NOTE — ED Provider Notes (Signed)
[facility] EMERGENCY DEPARTMENT Provider Note   CSN: [id number] Arrival date & time: 02/14/19  0174    History   Chief Complaint Chief Complaint  Patient presents with  . Sore Throat    HPI Becky Scott is a 28 y.o. female.     HPI Pt was seen at [place].  Per pt, c/o gradual onset and persistence of constant sore throat, runny/stuffy nose, sinus congestion, and cough for the past 1 week, worse over the past 2 days. States she has had home fevers to "103," as well as generalized body aches, and intermittent episodes of N/V/D. States she was evaluated at [facility] 2 days ago, had a negative strep test, and "was told I have a virus." Pt states she continues to have symptoms, so she came back to the ED for evaluation. Pt did not receive her flu shot this season. Denies rash, no CP/SOB, no abd pain.    Past Medical History:  Diagnosis Date  . Allergy   . Asthma   . Hypertension   . Trichomonas     Patient Active Problem List   Diagnosis Date Noted  . Severe obesity (BMI >= 40) ([place]) 03/30/2016  . Family history of breast cancer in mother 08/19/2015  . Family history of thyroid cancer 08/19/2015  . Routine general medical examination at a health care facility 05/21/2015  . Mild intermittent asthma without complication 94/49/6759    Past Surgical History:  Procedure Laterality Date  . NO PAST SURGERIES       OB History    Gravida  2   Para  1   Term  1   Preterm      AB  1   Living  1     SAB  1   TAB      Ectopic      Multiple      Live Births  1            Home Medications    Prior to Admission medications   Medication Sig Start Date End Date Taking? Authorizing Provider  acetaminophen (TYLENOL) 325 MG tablet Take 650 mg every 6 (six) hours as needed by mouth for headache.    [provider]  budesonide-formoterol (SYMBICORT) 160-4.5 MCG/ACT inhaler Take 2 puffs first thing in am and then another 2 puffs about 12 hours later. 03/30/16    Tanda Rockers, MD  loratadine (CLARITIN) 10 MG tablet Take 10 mg by mouth daily as needed for allergies.    [provider]    Family History Family History  Problem Relation Age of Onset  . Breast cancer Mother 99       negative BRCA testing in 2010  . Thyroid cancer Mother 8       papillary type  . Prostate cancer Father        dx. 77 or younger  . Prostate cancer Paternal Grandfather        lim info  . Lung cancer Maternal Grandmother 67       was a smoker  . Other Maternal Aunt        potentially also had genetic testing  . Cervical cancer Paternal Grandmother   . Breast cancer Other        dx. 69s    Social History Social History   Tobacco Use  . Smoking status: Current Every Day Smoker    Packs/day: 0.50    Years: 2.00    Pack years: 1.00  Types: Cigarettes  . Smokeless tobacco: Never Used  Substance Use Topics  . Alcohol use: No    Alcohol/week: 0.0 standard drinks  . Drug use: No     Allergies   Patient has no known allergies.   Review of Systems Review of Systems ROS: Statement: All systems negative except as marked or noted in the HPI; Constitutional: +fever and chills, generalized body aches.. ; ; Eyes: Negative for eye pain, redness and discharge. ; ; ENMT: Negative for ear pain, hoarseness, +nasal congestion, sinus pressure and sore throat. ; ; Cardiovascular: Negative for chest pain, palpitations, diaphoresis, dyspnea and peripheral edema. ; ; Respiratory: +cough. Negative for wheezing and stridor. ; ; Gastrointestinal: +N/V/D. Negative for abdominal pain, blood in stool, hematemesis, jaundice and rectal bleeding. . ; ; Genitourinary: Negative for dysuria, flank pain and hematuria. ; ; Musculoskeletal: Negative for back pain and neck pain. Negative for swelling and trauma.; ; Skin: Negative for pruritus, rash, abrasions, blisters, bruising and skin lesion.; ; Neuro: Negative for headache, lightheadedness and neck stiffness. Negative for  weakness, altered level of consciousness, altered mental status, extremity weakness, paresthesias, involuntary movement, seizure and syncope.       Physical Exam Updated Vital Signs BP 130/82 (BP Location: Right Arm)   Pulse (!) 106   Temp 99.7 F (37.6 C) (Oral)   Ht '5\' 3"'  (1.6 m)   LMP 02/09/2019   SpO2 100%   BMI 44.64 kg/m   Physical Exam 0750: Physical examination:  Nursing notes reviewed; Vital signs and O2 SAT reviewed;  Constitutional: Well developed, Well nourished, Well hydrated, In no acute distress; Head:  Normocephalic, atraumatic; Eyes: EOMI, PERRL, No scleral icterus; ENMT: TM's clear bilat. +edemetous nasal turbinates bilat with clear rhinorrhea. Mouth and pharynx without lesions. +posterior pharyngeal erythema, +tonsillar exudates. No intra-oral edema. No submandibular or sublingual edema. No hoarse voice, no drooling, no stridor. No pain with manipulation of larynx. No trismus. Mouth and pharynx normal, Mucous membranes moist; Neck: Supple, Full range of motion, No lymphadenopathy; Cardiovascular: Regular rate and rhythm, No gallop; Respiratory: Breath sounds clear & equal bilaterally, No wheezes.  Speaking full sentences with ease, Normal respiratory effort/excursion; Chest: Nontender, Movement normal; Abdomen: Soft, Nontender, Nondistended, Normal bowel sounds; Genitourinary: No CVA tenderness; Extremities: Peripheral pulses normal, No tenderness, No edema, No calf edema or asymmetry.; Neuro: AA&Ox3, Major CN grossly intact.  Speech clear. No gross focal motor or sensory deficits in extremities.; Skin: Color normal, Warm, Dry.   ED Treatments / Results  Labs (all labs ordered are listed, but only abnormal results are displayed)   EKG None  Radiology   Procedures Procedures (including critical care time)  Medications Ordered in ED Medications  acetaminophen (TYLENOL) tablet 650 mg (has no administration in time range)  ibuprofen (ADVIL,MOTRIN) tablet 400 mg  (has no administration in time range)     Initial Impression / Assessment and Plan / ED Course  I have reviewed the triage vital signs and the nursing notes.  Pertinent labs & imaging results that were available during my care of the patient were reviewed by me and considered in my medical decision making (see chart for details).     MDM Reviewed: previous chart, vitals and nursing note Reviewed previous: labs Interpretation: labs and x-ray   Results for orders placed or performed during the hospital encounter of 02/14/19  Group A Strep by PCR  Result Value Ref Range   Group A Strep by PCR NOT DETECTED NOT DETECTED  Mononucleosis screen  Result Value Ref Range   Mono Screen NEGATIVE NEGATIVE  Influenza panel by PCR (type A & B)  Result Value Ref Range   Influenza A By PCR NEGATIVE NEGATIVE   Influenza B By PCR NEGATIVE NEGATIVE   Dg Chest 2 View Result Date: 02/14/2019 CLINICAL DATA:  Cough for 1 week EXAM: CHEST - 2 VIEW COMPARISON:  10/28/2017 FINDINGS: Cardiac shadows within normal limits. The lungs are well aerated bilaterally without focal confluent infiltrate. Mild increased interstitial markings are noted likely related underlying bronchitis. No sizable effusion is seen. No bony abnormality is noted. IMPRESSION: Increased interstitial markings likely related to bronchitis. Electronically Signed   By: Inez Catalina M.D.   On: 02/14/2019 09:14    0940:  Workup reassuring. Tx symptomatically at this time. Pt has tol PO well without N/V while in the ED. Dx and testing d/w pt.  Questions answered.  Verb understanding, agreeable to d/c home with outpt f/u.     Final Clinical Impressions(s) / ED Diagnoses   Final diagnoses:  None    ED Discharge Orders    None       Francine Graven, DO 02/16/19 4098

## 2019-02-14 NOTE — Discharge Instructions (Signed)
Take over the counter tylenol and ibuprofen, as directed on packaging, as needed for discomfort.  Gargle with warm water several times per day to help with discomfort.  May also use over the counter sore throat pain medicines such as chloraseptic or sucrets, as directed on packaging, as needed for discomfort. Increase your fluid intake (ie:  Gatoraide) for the next few days.  Eat a bland diet. Take the prescription as directed.  Call your regular medical doctor today to schedule a follow up appointment this week.  Return to the Emergency Department immediately sooner if worsening.

## 2019-02-14 NOTE — ED Triage Notes (Addendum)
Pt reports that she was seen by urgent care with ST, body aches, HA . Strep swab neg. Conts to have severe ST. Pt has been sick over one week

## 2019-02-15 ENCOUNTER — Telehealth (HOSPITAL_COMMUNITY): Payer: Self-pay | Admitting: Emergency Medicine

## 2019-02-15 NOTE — Telephone Encounter (Signed)
Tried to call patient about results, call cannot be completed.

## 2019-03-07 ENCOUNTER — Encounter (HOSPITAL_COMMUNITY): Payer: Self-pay | Admitting: Emergency Medicine

## 2019-03-07 ENCOUNTER — Emergency Department (HOSPITAL_COMMUNITY)
Admission: EM | Admit: 2019-03-07 | Discharge: 2019-03-07 | Disposition: A | Discharge status: Home / Self Care | Payer: 59 | Attending: Emergency Medicine | Admitting: Emergency Medicine

## 2019-03-07 ENCOUNTER — Other Ambulatory Visit: Payer: Self-pay

## 2019-03-07 DIAGNOSIS — L247 Irritant contact dermatitis due to plants, except food: Secondary | ICD-10-CM | POA: Insufficient documentation

## 2019-03-07 DIAGNOSIS — R21 Rash and other nonspecific skin eruption: Secondary | ICD-10-CM | POA: Diagnosis present

## 2019-03-07 DIAGNOSIS — J45909 Unspecified asthma, uncomplicated: Secondary | ICD-10-CM | POA: Insufficient documentation

## 2019-03-07 DIAGNOSIS — Z79899 Other long term (current) drug therapy: Secondary | ICD-10-CM | POA: Insufficient documentation

## 2019-03-07 DIAGNOSIS — F17210 Nicotine dependence, cigarettes, uncomplicated: Secondary | ICD-10-CM | POA: Diagnosis not present

## 2019-03-07 MED ORDER — PREDNISONE 10 MG PO TABS: ORAL_TABLET | ORAL | 0 refills | Status: DC

## 2019-03-07 MED ORDER — TETRACAINE HCL 0.5 % OP SOLN
2.0000 [drp] | Freq: Once | OPHTHALMIC | Status: AC
  Administered 2019-03-07: 2 [drp] via OPHTHALMIC
  Filled 2019-03-07: qty 4

## 2019-03-07 MED ORDER — HYDROXYZINE HCL 25 MG PO TABS: 25.0000 mg | ORAL_TABLET | Freq: Four times a day (QID) | ORAL | 0 refills | Status: DC

## 2019-03-07 MED ORDER — DEXAMETHASONE SODIUM PHOSPHATE 10 MG/ML IJ SOLN
10.0000 mg | Freq: Once | INTRAMUSCULAR | Status: AC
  Administered 2019-03-07: 10 mg via INTRAMUSCULAR
  Filled 2019-03-07: qty 1

## 2019-03-07 MED ORDER — HYDROXYZINE HCL 25 MG PO TABS
25.0000 mg | ORAL_TABLET | Freq: Once | ORAL | Status: AC
  Administered 2019-03-07: 25 mg via ORAL
  Filled 2019-03-07: qty 1

## 2019-03-07 MED ORDER — FLUORESCEIN SODIUM 1 MG OP STRP
1.0000 | ORAL_STRIP | Freq: Once | OPHTHALMIC | Status: AC
  Administered 2019-03-07: 1 via OPHTHALMIC
  Filled 2019-03-07: qty 1

## 2019-03-07 NOTE — Discharge Instructions (Addendum)
Start taking your prednisone tomorrow.  Use atarax for itching if this works better for you than benadryl.  I also recommend gold bond anti itch cream (or any generic version of this containing menthol as this will help greatly with itching, but do not apply around your eyes.).  Cold compresses around the eyes can help with itching. The vision in your left is 20/100, your right eye is 20/25 today.  I recommend having this rechecked by your eye doctor if the eye pain or vision issue continues after the rash is resolving.

## 2019-03-07 NOTE — ED Triage Notes (Signed)
Pt reports pulling weeds two days ago and now has rash to BUE, BLE, and trunk. States her L eye is also burning. Has taken Benadryl without relief.

## 2019-03-07 NOTE — ED Provider Notes (Signed)
[facility] EMERGENCY DEPARTMENT Provider Note   CSN: [id number] Arrival date & time: 03/07/19  1809    History   Chief Complaint Chief Complaint  Patient presents with  . Rash    HPI Becky Scott is a 28 y.o. female presenting with a pruritic rash which is mostly on all 4 extremities, but also including her face and some on her trunk and developed the day after she pulled weeds in her yard 2 days ago.  She is not sure if she came in contact with poison ivy/oak, does not know what this plant looks like.  She has used Benadryl and a topical itch cream without relief.  She also reports itching and burning of her left eyelids but also has discomfort of the globe as well.  She denies a foreign body sensation and there is been no drainage from the eye.  She also denies any visual changes in her eye.     The history is provided by the patient.    Past Medical History:  Diagnosis Date  . Allergy   . Asthma   . Trichomonas     Patient Active Problem List   Diagnosis Date Noted  . Severe obesity (BMI >= 40) ([place]) 03/30/2016  . Family history of breast cancer in mother 08/19/2015  . Family history of thyroid cancer 08/19/2015  . Routine general medical examination at a health care facility 05/21/2015  . Mild intermittent asthma without complication 96/29/5284    Past Surgical History:  Procedure Laterality Date  . NO PAST SURGERIES       OB History    Gravida  2   Para  1   Term  1   Preterm      AB  1   Living  1     SAB  1   TAB      Ectopic      Multiple      Live Births  1            Home Medications    Prior to Admission medications   Medication Sig Start Date End Date Taking? Authorizing Provider  acetaminophen (TYLENOL) 325 MG tablet Take 650 mg every 6 (six) hours as needed by mouth for headache.    [provider]  budesonide-formoterol (SYMBICORT) 160-4.5 MCG/ACT inhaler Take 2 puffs first thing in am and then another 2 puffs  about 12 hours later. 03/30/16   Tanda Rockers, MD  etonogestrel (NEXPLANON) 68 MG IMPL implant Inject 1 each into the skin once.    [provider]  hydrOXYzine (ATARAX/VISTARIL) 25 MG tablet Take 1 tablet (25 mg total) by mouth every 6 (six) hours. 03/07/19   Evalee Jefferson, PA-C  loratadine (CLARITIN) 10 MG tablet Take 10 mg by mouth daily as needed for allergies.    [provider]  predniSONE (DELTASONE) 10 MG tablet Take 5 tabs daily for 2 days,  4 tabs daily for 2 days,  3 tabs daily for 2 days,  2 tabs daily for 2 days,  Then 1 tab daily for 2 days. 03/07/19   Evalee Jefferson, PA-C    Family History Family History  Problem Relation Age of Onset  . Breast cancer Mother 52       negative BRCA testing in 2010  . Thyroid cancer Mother 73       papillary type  . Prostate cancer Father        dx. 49 or younger  . Prostate  cancer Paternal Grandfather        lim info  . Lung cancer Maternal Grandmother 67       was a smoker  . Other Maternal Aunt        potentially also had genetic testing  . Cervical cancer Paternal Grandmother   . Breast cancer Other        dx. 55s    Social History Social History   Tobacco Use  . Smoking status: Current Every Day Smoker    Packs/day: 0.50    Years: 2.00    Pack years: 1.00    Types: Cigarettes  . Smokeless tobacco: Never Used  Substance Use Topics  . Alcohol use: No    Alcohol/week: 0.0 standard drinks  . Drug use: No     Allergies   Patient has no known allergies.   Review of Systems Review of Systems  Constitutional: Negative for chills and fever.  Eyes: Positive for pain.  Respiratory: Negative for shortness of breath and wheezing.   Skin: Positive for rash.  Neurological: Negative for numbness.     Physical Exam Updated Vital Signs BP 124/88 (BP Location: Right Arm)   Pulse 94   Temp 98 F (36.7 C) (Oral)   Resp 16   Ht _0  (1.6 m)   Wt 95.3 kg   LMP 02/09/2019   SpO2 98%   BMI 37.20 kg/m    Physical Exam Constitutional:      General: She is not in acute distress.    Appearance: She is well-developed.  HENT:     Head: Normocephalic.  Eyes:     General: Lids are everted, no foreign bodies appreciated.        Right eye: No foreign body or discharge.        Left eye: No foreign body or discharge.     Extraocular Movements:     Right eye: Normal extraocular motion.     Left eye: Normal extraocular motion.     Conjunctiva/sclera:     Right eye: Right conjunctiva is not injected. No chemosis.    Left eye: Left conjunctiva is not injected. No chemosis.    Pupils:     Left eye: No corneal abrasion or fluorescein uptake.     Slit lamp exam:    Left eye: No corneal ulcer or foreign body.     Comments: Visual Acuity R Distance: 20/25 L Distance: 20/100  Globes normal, no vesicular rash. No dendrites.   Neck:     Musculoskeletal: Neck supple.  Cardiovascular:     Rate and Rhythm: Normal rate.  Pulmonary:     Effort: Pulmonary effort is normal.     Breath sounds: No wheezing.  Musculoskeletal: Normal range of motion.  Skin:    Findings: Rash present. Rash is vesicular.     Comments: Scattered linear vesicles mainly on extremities, excoriations present. No active drainage. Left upper eyelid with erythema without vesicles.       ED Treatments / Results  Labs (all labs ordered are listed, but only abnormal results are displayed) Labs Reviewed - No data to display  EKG None  Radiology No results found.  Procedures Procedures (including critical care time)  Medications Ordered in ED Medications  tetracaine (PONTOCAINE) 0.5 % ophthalmic solution 2 drop (2 drops Left Eye Given 03/07/19 1907)  fluorescein ophthalmic strip 1 strip (1 strip Left Eye Given 03/07/19 1907)  dexamethasone (DECADRON) injection 10 mg (10 mg Intramuscular Given 03/07/19 1939)  hydrOXYzine (ATARAX/VISTARIL)  tablet 25 mg (25 mg Oral Given 03/07/19 1940)     Initial Impression / Assessment  and Plan / ED Course  I have reviewed the triage vital signs and the nursing notes.  Pertinent labs & imaging results that were available during my care of the patient were reviewed by me and considered in my medical decision making (see chart for details).        Pt with contact dermatitis, probably poison ivy/oak as occurred after weeding.  Prednisone, atarax, discussed other home tx for itch relief while prednisone is resolving the rash.  Advised recheck by her ophthalmologist if eye irritation persists, advised to avoid rubbing the eye, but to use cold compresses to help with itching.  Final Clinical Impressions(s) / ED Diagnoses   Final diagnoses:  Irritant contact dermatitis due to plants, except food    ED Discharge Orders         Ordered    hydrOXYzine (ATARAX/VISTARIL) 25 MG tablet  Every 6 hours     03/07/19 1945    predniSONE (DELTASONE) 10 MG tablet     03/07/19 1945           Landis Martins 03/07/19 Kreg Shropshire, MD 03/07/19 2210

## 2019-03-07 NOTE — ED Notes (Signed)
ED Provider at bedside. 

## 2019-03-28 ENCOUNTER — Encounter (HOSPITAL_COMMUNITY): Payer: Self-pay

## 2019-03-28 ENCOUNTER — Emergency Department (HOSPITAL_COMMUNITY)
Admission: EM | Admit: 2019-03-28 | Discharge: 2019-03-28 | Disposition: A | Discharge status: Home / Self Care | Payer: 59 | Attending: Emergency Medicine | Admitting: Emergency Medicine

## 2019-03-28 ENCOUNTER — Other Ambulatory Visit: Payer: Self-pay

## 2019-03-28 DIAGNOSIS — R05 Cough: Secondary | ICD-10-CM | POA: Diagnosis present

## 2019-03-28 DIAGNOSIS — F17210 Nicotine dependence, cigarettes, uncomplicated: Secondary | ICD-10-CM | POA: Diagnosis not present

## 2019-03-28 DIAGNOSIS — J4521 Mild intermittent asthma with (acute) exacerbation: Secondary | ICD-10-CM

## 2019-03-28 MED ORDER — LORATADINE 10 MG PO TABS
10.0000 mg | ORAL_TABLET | Freq: Every day | ORAL | Status: DC
  Administered 2019-03-28: 10 mg via ORAL
  Filled 2019-03-28: qty 1

## 2019-03-28 MED ORDER — ALBUTEROL SULFATE HFA 108 (90 BASE) MCG/ACT IN AERS
2.0000 | INHALATION_SPRAY | Freq: Once | RESPIRATORY_TRACT | Status: AC
  Administered 2019-03-28: 09:00:00 2 via RESPIRATORY_TRACT
  Filled 2019-03-28: qty 6.7

## 2019-03-28 MED ORDER — PREDNISONE 50 MG PO TABS: 50.0000 mg | ORAL_TABLET | Freq: Every day | ORAL | 0 refills | Status: DC

## 2019-03-28 MED ORDER — BUDESONIDE-FORMOTEROL FUMARATE 160-4.5 MCG/ACT IN AERO: 2.0000 | INHALATION_SPRAY | Freq: Every day | RESPIRATORY_TRACT | 12 refills | Status: DC

## 2019-03-28 MED ORDER — PREDNISONE 50 MG PO TABS
60.0000 mg | ORAL_TABLET | Freq: Once | ORAL | Status: AC
  Administered 2019-03-28: 60 mg via ORAL
  Filled 2019-03-28: qty 1

## 2019-03-28 NOTE — ED Provider Notes (Signed)
[facility] EMERGENCY DEPARTMENT Provider Note   CSN: [id number] Arrival date & time: 03/28/19  0736    History   Chief Complaint Chief Complaint  Patient presents with  . Cough    HPI Becky Scott is a 28 y.o. female.  HPI Patient presents to the emergency room for evaluation of cough and shortness of breath.  Patient states she has a history of seasonal asthma.  She has not had an exacerbation in a while so she does not have any medications available at home.  She used to use Symbicort.  Patient states she did not use her rescue inhaler.  He has some tightness in her chest but denies any chest pain.  She has not had any fevers.  She denies any myalgias or other complaints. Past Medical History:  Diagnosis Date  . Allergy   . Asthma   . Trichomonas     Patient Active Problem List   Diagnosis Date Noted  . Severe obesity (BMI >= 40) ([place]) 03/30/2016  . Family history of breast cancer in mother 08/19/2015  . Family history of thyroid cancer 08/19/2015  . Routine general medical examination at a health care facility 05/21/2015  . Mild intermittent asthma without complication 19/37/9024    Past Surgical History:  Procedure Laterality Date  . NO PAST SURGERIES       OB History    Gravida  2   Para  1   Term  1   Preterm      AB  1   Living  1     SAB  1   TAB      Ectopic      Multiple      Live Births  1            Home Medications    Prior to Admission medications   Medication Sig Start Date End Date Taking? Authorizing Provider  acetaminophen (TYLENOL) 325 MG tablet Take 650 mg every 6 (six) hours as needed by mouth for headache.   Yes [provider]  loratadine (CLARITIN) 10 MG tablet Take 10 mg by mouth daily as needed for allergies.   Yes [provider]  budesonide-formoterol (SYMBICORT) 160-4.5 MCG/ACT inhaler Inhale 2 puffs into the lungs daily. 03/28/19   Dorie Rank, MD  predniSONE (DELTASONE) 50 MG tablet Take 1  tablet (50 mg total) by mouth daily. 03/28/19   Dorie Rank, MD    Family History Family History  Problem Relation Age of Onset  . Breast cancer Mother 38       negative BRCA testing in 2010  . Thyroid cancer Mother 55       papillary type  . Prostate cancer Father        dx. 19 or younger  . Prostate cancer Paternal Grandfather        lim info  . Lung cancer Maternal Grandmother 67       was a smoker  . Other Maternal Aunt        potentially also had genetic testing  . Cervical cancer Paternal Grandmother   . Breast cancer Other        dx. 20s    Social History Social History   Tobacco Use  . Smoking status: Current Every Day Smoker    Packs/day: 0.50    Years: 2.00    Pack years: 1.00    Types: Cigarettes  . Smokeless tobacco: Never Used  Substance Use Topics  . Alcohol use:  No    Alcohol/week: 0.0 standard drinks  . Drug use: No     Allergies   Patient has no known allergies.   Review of Systems Review of Systems  All other systems reviewed and are negative.    Physical Exam Updated Vital Signs BP 111/73   Pulse (!) 53   Temp 98.4 F (36.9 C) (Oral)   Resp (!) 21   Ht 1.6 m (_0 )   Wt 95.3 kg   LMP 03/26/2019   SpO2 100%   BMI 37.20 kg/m   Physical Exam Vitals signs and nursing note reviewed.  Constitutional:      General: She is not in acute distress.    Appearance: She is well-developed.  HENT:     Head: Normocephalic and atraumatic.     Right Ear: External ear normal.     Left Ear: External ear normal.     Nose: No rhinorrhea.  Eyes:     General: No scleral icterus.       Right eye: No discharge.        Left eye: No discharge.     Conjunctiva/sclera: Conjunctivae normal.  Neck:     Musculoskeletal: Neck supple.     Trachea: No tracheal deviation.  Cardiovascular:     Rate and Rhythm: Normal rate and regular rhythm.  Pulmonary:     Effort: Pulmonary effort is normal. No respiratory distress.     Breath sounds: No stridor.  Wheezing present. No rales.  Abdominal:     General: Bowel sounds are normal. There is no distension.     Palpations: Abdomen is soft.     Tenderness: There is no abdominal tenderness. There is no guarding or rebound.  Musculoskeletal:        General: No tenderness.  Skin:    General: Skin is warm and dry.     Findings: No rash.  Neurological:     Mental Status: She is alert.     Cranial Nerves: No cranial nerve deficit (no facial droop, extraocular movements intact, no slurred speech).     Sensory: No sensory deficit.     Motor: No abnormal muscle tone or seizure activity.     Coordination: Coordination normal.      ED Treatments / Results  Labs (all labs ordered are listed, but only abnormal results are displayed) Labs Reviewed - No data to display  EKG EKG Interpretation  Date/Time:  Wednesday March 28 2019 07:54:05 EDT Ventricular Rate:  66 PR Interval:    QRS Duration: 104 QT Interval:  418 QTC Calculation: 438 R Axis:   79 Text Interpretation:  Sinus arrhythmia No significant change since last tracing Confirmed by Dorie Rank [phone]) on 03/28/2019 7:59:00 AM   Radiology No results found.  Procedures Procedures (including critical care time)  Medications Ordered in ED Medications  loratadine (CLARITIN) tablet 10 mg (10 mg Oral Given 03/28/19 0851)  albuterol (PROVENTIL HFA;VENTOLIN HFA) 108 (90 Base) MCG/ACT inhaler 2 puff (2 puffs Inhalation Given 03/28/19 0841)  predniSONE (DELTASONE) tablet 60 mg (60 mg Oral Given 03/28/19 0851)     Initial Impression / Assessment and Plan / ED Course  I have reviewed the triage vital signs and the nursing notes.  Pertinent labs & imaging results that were available during my care of the patient were reviewed by me and considered in my medical decision making (see chart for details).  Clinical Course as of Mar 28 955  Wed Mar 28, 2019  0953 Pt  feels much better after alb HFA.  REady for dc   [JK]    Clinical Course User  Index [JK] Dorie Rank, MD     Symptoms consistent with an asthma exacerbation.  This is likely related to her allergies.  I have a low suspicion for covid type illness.  Patient improved with her albuterol inhaler.  Plan on discharge home with a prescription for her Symbicort, prednisone and she will take her albuterol with her.  Final Clinical Impressions(s) / ED Diagnoses   Final diagnoses:  Mild intermittent asthma with exacerbation    ED Discharge Orders         Ordered    budesonide-formoterol (SYMBICORT) 160-4.5 MCG/ACT inhaler  Daily     03/28/19 0955    predniSONE (DELTASONE) 50 MG tablet  Daily     03/28/19 0955           Dorie Rank, MD 03/28/19 [phone]

## 2019-03-28 NOTE — ED Triage Notes (Signed)
Pt reports has "seasonal asthma."  Reports hasn't had  A flare up in the past 2 years.  Pt says used to use symbicort.  Pt says she thinks she hasn't had fever but isn't sure.  Reports chest feels tight.  Denies

## 2019-03-28 NOTE — Discharge Instructions (Addendum)
Also take an over the counter antihistamine daily, follow up with a primary care or pulmonary doctor if the sx persist

## 2019-05-07 ENCOUNTER — Other Ambulatory Visit: Payer: Self-pay

## 2019-05-07 ENCOUNTER — Encounter (HOSPITAL_COMMUNITY): Payer: Self-pay | Admitting: Emergency Medicine

## 2019-05-07 ENCOUNTER — Ambulatory Visit (HOSPITAL_COMMUNITY)
Admission: EM | Admit: 2019-05-07 | Discharge: 2019-05-07 | Disposition: A | Discharge status: Home / Self Care | Payer: 59 | Attending: Family Medicine | Admitting: Family Medicine

## 2019-05-07 DIAGNOSIS — R1084 Generalized abdominal pain: Secondary | ICD-10-CM

## 2019-05-07 LAB — POCT URINALYSIS DIP (DEVICE)
Bilirubin Urine: NEGATIVE
Glucose, UA: NEGATIVE mg/dL
Hgb urine dipstick: NEGATIVE
Ketones, ur: NEGATIVE mg/dL
Leukocytes,Ua: NEGATIVE
Nitrite: NEGATIVE
Protein, ur: NEGATIVE mg/dL
Specific Gravity, Urine: 1.025 (ref 1.005–1.030)
Urobilinogen, UA: 0.2 mg/dL (ref 0.0–1.0)
pH: 6 (ref 5.0–8.0)

## 2019-05-07 LAB — POCT PREGNANCY, URINE: Preg Test, Ur: NEGATIVE

## 2019-05-07 NOTE — ED Provider Notes (Signed)
[facility]    CSN: [id number] Arrival date & time: 05/07/19  1207     History   Chief Complaint Chief Complaint  Patient presents with  . Abdominal Pain    HPI Becky Scott is a 28 y.o. female.   Pt is a 28 year old female that presents with fever, chills, abdominal pain, nausea, vomiting and diarrhea.  Symptoms started yesterday morning.  The pain is intermittent.  Vomited 3 times yesterday.  Reported fever last night of 103.  Mild increase shortness of breath and using her inhaler more often.  Diarrhea started today.  No significant cough or chest congestion.  Denies any sore throat or ear pain.  She is a Development worker, community carrier.  Unsure of any sick contacts or COVID exposure.  Reported that her last menstrual period was over a month ago.  She is sexually active with one partner, unprotected.  Denies any concern for STDs.  Denies any dysuria, hematuria, urinary frequency or vaginal discharge.   ROS per HPI      Past Medical History:  Diagnosis Date  . Allergy   . Asthma   . Trichomonas     Patient Active Problem List   Diagnosis Date Noted  . Severe obesity (BMI >= 40) ([place]) 03/30/2016  . Family history of breast cancer in mother 08/19/2015  . Family history of thyroid cancer 08/19/2015  . Routine general medical examination at a health care facility 05/21/2015  . Mild intermittent asthma without complication 37/09/6268    Past Surgical History:  Procedure Laterality Date  . NO PAST SURGERIES      OB History    Gravida  2   Para  1   Term  1   Preterm      AB  1   Living  1     SAB  1   TAB      Ectopic      Multiple      Live Births  1            Home Medications    Prior to Admission medications   Medication Sig Start Date End Date Taking? Authorizing Provider  acetaminophen (TYLENOL) 325 MG tablet Take 650 mg every 6 (six) hours as needed by mouth for headache.   Yes [provider]  ALBUTEROL IN Inhale into the  lungs.   Yes [provider]    Family History Family History  Problem Relation Age of Onset  . Breast cancer Mother 44       negative BRCA testing in 2010  . Thyroid cancer Mother 37       papillary type  . Prostate cancer Father        dx. 43 or younger  . Prostate cancer Paternal Grandfather        lim info  . Lung cancer Maternal Grandmother 67       was a smoker  . Other Maternal Aunt        potentially also had genetic testing  . Cervical cancer Paternal Grandmother   . Breast cancer Other        dx. 58s    Social History Social History   Tobacco Use  . Smoking status: Current Every Day Smoker    Packs/day: 0.50    Years: 2.00    Pack years: 1.00    Types: Cigarettes  . Smokeless tobacco: Never Used  Substance Use Topics  . Alcohol use: Yes    Alcohol/week:  0.0 standard drinks  . Drug use: No     Allergies   Patient has no known allergies.   Review of Systems Review of Systems   Physical Exam Triage Vital Signs ED Triage Vitals  Enc Vitals Group     BP 05/07/19 1254 (!) 134/96     Pulse Rate 05/07/19 1254 80     Resp 05/07/19 1254 18     Temp 05/07/19 1254 98.1 F (36.7 C)     Temp Source 05/07/19 1254 Oral     SpO2 05/07/19 1254 100 %     Weight --      Height --      Head Circumference --      Peak Flow --      Pain Score 05/07/19 1250 8     Pain Loc --      Pain Edu? --      Excl. in [place]? --    No data found.  Updated Vital Signs BP (!) 134/96 (BP Location: Left Arm)   Pulse 80   Temp 98.1 F (36.7 C) (Oral)   Resp 18   LMP 04/07/2019   SpO2 100%   Visual Acuity Right Eye Distance:   Left Eye Distance:   Bilateral Distance:    Right Eye Near:   Left Eye Near:    Bilateral Near:     Physical Exam Vitals signs and nursing note reviewed.  Constitutional:      General: She is not in acute distress.    Appearance: She is ill-appearing.     Comments: Rigors   HENT:     Head: Normocephalic and atraumatic.      Mouth/Throat:     Pharynx: Oropharynx is clear.  Cardiovascular:     Rate and Rhythm: Normal rate and regular rhythm.     Heart sounds: Normal heart sounds.  Pulmonary:     Effort: Pulmonary effort is normal.     Breath sounds: Normal breath sounds.  Abdominal:     General: Bowel sounds are normal.     Palpations: Abdomen is soft. There is no shifting dullness, fluid wave, hepatomegaly, splenomegaly, mass or pulsatile mass.     Tenderness: There is abdominal tenderness in the right lower quadrant, epigastric area, suprapubic area, left upper quadrant and left lower quadrant. There is guarding. There is no right CVA tenderness or left CVA tenderness.  Skin:    General: Skin is warm and dry.  Psychiatric:        Mood and Affect: Mood normal.      UC Treatments / Results  Labs (all labs ordered are listed, but only abnormal results are displayed) Labs Reviewed  POC URINE PREG, ED  POCT URINALYSIS DIP (DEVICE)  POCT PREGNANCY, URINE    EKG None  Radiology No results found.  Procedures Procedures (including critical care time)  Medications Ordered in UC Medications - No data to display  Initial Impression / Assessment and Plan / UC Course  I have reviewed the triage vital signs and the nursing notes.  Pertinent labs & imaging results that were available during my care of the patient were reviewed by me and considered in my medical decision making (see chart for details).      Patient is a 28 year old female with fever, chills, rigors.  She is very tender to palpation of the left upper quadrant along with right lower quadrant, left lower quadrant and periumbilical area.  Guarded during exam. No CVA tenderness Reporting fever as  high as 103 at home. She is currently afebrile here but took Tylenol Based on the amount of abdominal discomfort she is having I am sending her to the ER for further evaluation management.  Her urine was negative for pregnancy or infection. Final  Clinical Impressions(s) / UC Diagnoses   Final diagnoses:  Generalized abdominal pain     Discharge Instructions     I would like for you to go to the ER for further evaluation.     ED Prescriptions    None     Controlled Substance Prescriptions [place] Controlled Substance Registry consulted? Not Applicable   Orvan July, NP 05/07/19 1340

## 2019-05-07 NOTE — ED Triage Notes (Signed)
Onset of symptoms yesterday morning with abdominal pain, intermittent pain.  Reports vomiting a couple of times yesterday.  Reports fever last night 103?   Intermittent cough ?sob, difficulty catching her breath, using inhaler more often Diarrhea started today Chills started today

## 2019-05-07 NOTE — Discharge Instructions (Addendum)
I would like for you to go to the ER for further evaluation.

## 2019-05-25 ENCOUNTER — Inpatient Hospital Stay (HOSPITAL_COMMUNITY)
Admission: AD | Admit: 2019-05-25 | Discharge: 2019-05-25 | Disposition: A | Discharge status: Home / Self Care | Payer: 59 | Attending: Obstetrics and Gynecology | Admitting: Obstetrics and Gynecology

## 2019-05-25 ENCOUNTER — Other Ambulatory Visit: Payer: Self-pay

## 2019-05-25 DIAGNOSIS — N23 Unspecified renal colic: Secondary | ICD-10-CM | POA: Diagnosis not present

## 2019-05-25 DIAGNOSIS — Z3202 Encounter for pregnancy test, result negative: Secondary | ICD-10-CM | POA: Insufficient documentation

## 2019-05-25 DIAGNOSIS — R309 Painful micturition, unspecified: Secondary | ICD-10-CM | POA: Diagnosis not present

## 2019-05-25 LAB — POCT PREGNANCY, URINE: Preg Test, Ur: NEGATIVE

## 2019-05-25 NOTE — MAU Note (Signed)
Started yesterday. Started having frequency, urgency, only going a little bit. Pain and pressure with urination.  Having pain in lower abd, been going on a few wks.  Does not believe she is pregnant.

## 2019-05-25 NOTE — MAU Provider Note (Signed)
First Provider Initiated Contact with Patient 05/25/19 [phone]     S [name] is a 28 y.o. [id number] non-pregnant female who presents to MAU today with complaint of burning with urination. She denies concern for pregnancy and reports a negative pregnancy test two weeks ago.  O BP (!) 133/94 (BP Location: Right Arm)   Pulse 68   Temp 98 F (36.7 C) (Oral)   Resp 16   SpO2 100%  Physical Exam  Nursing note and vitals reviewed. Constitutional: She is oriented to person, place, and time. She appears well-developed and well-nourished.  Cardiovascular: Normal rate.  Respiratory: Effort normal. No respiratory distress.  Neurological: She is alert and oriented to person, place, and time.  Skin: Skin is warm and dry.  Psychiatric: She has a normal mood and affect. Her behavior is normal. Judgment and thought content normal.    A Negative pregnancy test Medical screening exam complete  P Discharge from MAU in stable condition Patient given the option of transfer to [facility] for further evaluation or seek care in outpatient facility of choice List of options for follow-up given  Warning signs for worsening condition that would warrant emergency follow-up discussed Patient may return to MAU as needed for pregnancy related complaints  Calvert Cantor, CNM 05/25/2019 9:40 AM

## 2019-06-11 ENCOUNTER — Other Ambulatory Visit: Payer: Self-pay

## 2019-06-11 ENCOUNTER — Encounter (HOSPITAL_COMMUNITY): Payer: Self-pay

## 2019-06-11 ENCOUNTER — Emergency Department (HOSPITAL_COMMUNITY): Payer: 59

## 2019-06-11 ENCOUNTER — Emergency Department (HOSPITAL_COMMUNITY)
Admission: EM | Admit: 2019-06-11 | Discharge: 2019-06-12 | Disposition: A | Discharge status: Home / Self Care | Payer: 59 | Attending: Emergency Medicine | Admitting: Emergency Medicine

## 2019-06-11 DIAGNOSIS — Z5321 Procedure and treatment not carried out due to patient leaving prior to being seen by health care provider: Secondary | ICD-10-CM | POA: Insufficient documentation

## 2019-06-11 DIAGNOSIS — R0602 Shortness of breath: Secondary | ICD-10-CM | POA: Insufficient documentation
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
## 2019-06-12 NOTE — ED Notes (Signed)
Called pt for vitals check with no answer 

## 2019-08-16 ENCOUNTER — Ambulatory Visit
Admission: EM | Admit: 2019-08-16 | Discharge: 2019-08-16 | Disposition: A | Discharge status: Home / Self Care | Payer: 59 | Attending: Emergency Medicine | Admitting: Emergency Medicine

## 2019-08-16 ENCOUNTER — Other Ambulatory Visit: Payer: Self-pay

## 2019-08-16 DIAGNOSIS — L299 Pruritus, unspecified: Secondary | ICD-10-CM

## 2019-08-16 DIAGNOSIS — R21 Rash and other nonspecific skin eruption: Secondary | ICD-10-CM

## 2019-08-16 DIAGNOSIS — L298 Other pruritus: Secondary | ICD-10-CM

## 2019-08-16 DIAGNOSIS — L247 Irritant contact dermatitis due to plants, except food: Secondary | ICD-10-CM

## 2019-08-16 MED ORDER — PREDNISONE 20 MG PO TABS: 20.0000 mg | ORAL_TABLET | Freq: Two times a day (BID) | ORAL | 0 refills | Status: AC

## 2019-08-16 MED ORDER — HYDROXYZINE HCL 25 MG PO TABS: 25.0000 mg | ORAL_TABLET | Freq: Every evening | ORAL | 0 refills | Status: DC | PRN

## 2019-08-16 MED ORDER — METHYLPREDNISOLONE SODIUM SUCC 125 MG IJ SOLR
125.0000 mg | Freq: Once | INTRAMUSCULAR | Status: AC
  Administered 2019-08-16: 17:00:00 125 mg via INTRAMUSCULAR

## 2019-08-16 NOTE — Discharge Instructions (Signed)
Steroid shot given in office Wash with warm water and mild soap Take oral steroid as prescribed and to completion Prescribed hydroxyzine as needed for itching.  DO NOT TAKE while driving or operating heavy machinery Use OTC zyrtec, allegra or claritin as needed during the day Follow up with PCP if symptoms persists fever, chills, nausea, vomiting, oral manifestations, trouble breathing, chest pain, shortness of breath, symptoms worsen despite medications, etc..Marland Kitchen

## 2019-08-16 NOTE — ED Triage Notes (Signed)
Rash on hands and face after working in yard 2 days ago

## 2019-08-16 NOTE — ED Provider Notes (Signed)
[facility]   [id number] 08/16/19 Arrival Time: 7371  CC: Poison ivy/ oak rash  SUBJECTIVE:  Becky Scott is a 28 y.o. female who presents with rash after possible exposure to poison ivy/oak a few days ago.  Rash is diffuse about the arms, legs, and face.  Describes it as itchy, red, and spreading.  Has NOT tried OTC medications.  Symptoms are made worse with itching.  Reports similar symptoms in the past that improved with steroid.   Denies fever, chills, nausea, vomiting, oral manifestations, dyspnea, SOB, chest pain, abdominal pain, changes in bowel or bladder function.    ROS: As per HPI.  All other pertinent ROS negative.     Past Medical History:  Diagnosis Date  . Allergy   . Asthma   . Trichomonas    Past Surgical History:  Procedure Laterality Date  . NO PAST SURGERIES     No Known Allergies No current facility-administered medications on file prior to encounter.    Current Outpatient Medications on File Prior to Encounter  Medication Sig Dispense Refill  . acetaminophen (TYLENOL) 325 MG tablet Take 650 mg every 6 (six) hours as needed by mouth for headache.    . ALBUTEROL IN Inhale into the lungs.     Social History   Socioeconomic History  . Marital status: Single    Spouse name: Not on file  . Number of children: 1  . Years of education: 48  . Highest education level: Not on file  Occupational History  . Occupation: Sports administrator: [employer]  . Financial resource strain: Not on file  . Food insecurity    Worry: Not on file    Inability: Not on file  . Transportation needs    Medical: Not on file    Non-medical: Not on file  Tobacco Use  . Smoking status: Current Every Day Smoker    Packs/day: 0.50    Years: 2.00    Pack years: 1.00    Types: Cigarettes  . Smokeless tobacco: Never Used  Substance and Sexual Activity  . Alcohol use: Yes    Alcohol/week: 0.0 standard drinks  . Drug use: No  .  Sexual activity: Yes    Birth control/protection: None  Lifestyle  . Physical activity    Days per week: Not on file    Minutes per session: Not on file  . Stress: Not on file  Relationships  . Social Herbalist on phone: Not on file    Gets together: Not on file    Attends religious service: Not on file    Active member of club or organization: Not on file    Attends meetings of clubs or organizations: Not on file    Relationship status: Not on file  . Intimate partner violence    Fear of current or ex partner: Not on file    Emotionally abused: Not on file    Physically abused: Not on file    Forced sexual activity: Not on file  Other Topics Concern  . Not on file  Social History Narrative   Fun: Entertains her son.    Denies religious beliefs effecting health care.    Family History  Problem Relation Age of Onset  . Breast cancer Mother 70       negative BRCA testing in 2010  . Thyroid cancer Mother 57       papillary type  . Prostate  cancer Father        dx. 40 or younger  . Prostate cancer Paternal Grandfather        lim info  . Lung cancer Maternal Grandmother 67       was a smoker  . Other Maternal Aunt        potentially also had genetic testing  . Cervical cancer Paternal Grandmother   . Breast cancer Other        dx. 20s    OBJECTIVE: Vitals:   08/16/19 1637  BP: 130/81  Pulse: 65  Resp: 18  Temp: 98.1 F (36.7 C)  SpO2: 97%    General appearance: alert; no distress Head: NCAT Lungs: clear to auscultation bilaterally Heart: regular rate and rhythm.  Extremities: no edema Skin: warm and dry; areas of linear erythematous papules and macules diffuse about anterior shins, medial forearms, neck, and face; no obvious drainage or bleeding Psychological: alert and cooperative; normal mood and affect  ASSESSMENT & PLAN:  1. Irritant contact dermatitis due to plants, except food   2. Itching     Meds ordered this encounter  Medications   . methylPREDNISolone sodium succinate (SOLU-MEDROL) 125 mg/2 mL injection 125 mg  . predniSONE (DELTASONE) 20 MG tablet    Sig: Take 1 tablet (20 mg total) by mouth 2 (two) times daily with a meal for 5 days.    Dispense:  10 tablet    Refill:  0    Order Specific Question:   Supervising Provider    Answer:   Raylene Everts [[id number]]  . hydrOXYzine (ATARAX/VISTARIL) 25 MG tablet    Sig: Take 1 tablet (25 mg total) by mouth at bedtime as needed.    Dispense:  12 tablet    Refill:  0    Order Specific Question:   Supervising Provider    Answer:   Raylene Everts [[id number]]   Steroid shot given in office Wash with warm water and mild soap Take oral steroid as prescribed and to completion Prescribed hydroxyzine as needed for itching.  DO NOT TAKE while driving or operating heavy machinery Use OTC zyrtec, allegra or claritin as needed during the day Follow up with PCP if symptoms persists fever, chills, nausea, vomiting, oral manifestations, trouble breathing, chest pain, shortness of breath, symptoms worsen despite medications, etc...  Reviewed expectations re: course of current medical issues. Questions answered. Outlined signs and symptoms indicating need for more acute intervention. Patient verbalized understanding. After Visit Summary given.   Lestine Box, PA-C 08/16/19 1722

## 2019-11-06 ENCOUNTER — Emergency Department (HOSPITAL_COMMUNITY)
Admission: EM | Admit: 2019-11-06 | Discharge: 2019-11-06 | Disposition: A | Discharge status: Home / Self Care | Payer: 59 | Attending: Emergency Medicine | Admitting: Emergency Medicine

## 2019-11-06 ENCOUNTER — Encounter (HOSPITAL_COMMUNITY): Payer: Self-pay

## 2019-11-06 ENCOUNTER — Other Ambulatory Visit: Payer: Self-pay

## 2019-11-06 DIAGNOSIS — Z20828 Contact with and (suspected) exposure to other viral communicable diseases: Secondary | ICD-10-CM | POA: Diagnosis not present

## 2019-11-06 DIAGNOSIS — R519 Headache, unspecified: Secondary | ICD-10-CM

## 2019-11-06 DIAGNOSIS — F17210 Nicotine dependence, cigarettes, uncomplicated: Secondary | ICD-10-CM | POA: Diagnosis not present

## 2019-11-06 DIAGNOSIS — J45909 Unspecified asthma, uncomplicated: Secondary | ICD-10-CM | POA: Diagnosis not present

## 2019-11-06 DIAGNOSIS — B349 Viral infection, unspecified: Secondary | ICD-10-CM

## 2019-11-06 NOTE — ED Provider Notes (Signed)
[facility] DEPT Provider Note   CSN: [id number] Arrival date & time: 11/06/19  1841     History   Chief Complaint Chief Complaint  Patient presents with  . Headache  . bodyaches  . Chills    HPI Becky Scott is a 28 y.o. female.     Patient is a 28 year old female with history of asthma.  She presents today for evaluation of not feeling well.  Patient reports multiple complaints including headache, dizziness, body aches, nausea that have been ongoing for the past 2 days..  She denies any fevers or chills.  She denies any ill contacts.  The history is provided by the patient.    Past Medical History:  Diagnosis Date  . Allergy   . Asthma   . Trichomonas     Patient Active Problem List   Diagnosis Date Noted  . Severe obesity (BMI >= 40) ([place]) 03/30/2016  . Family history of breast cancer in mother 08/19/2015  . Family history of thyroid cancer 08/19/2015  . Routine general medical examination at a health care facility 05/21/2015  . Mild intermittent asthma without complication 12/87/8676    Past Surgical History:  Procedure Laterality Date  . NO PAST SURGERIES       OB History    Gravida  2   Para  1   Term  1   Preterm      AB  1   Living  1     SAB  1   TAB      Ectopic      Multiple      Live Births  1            Home Medications    Prior to Admission medications   Medication Sig Start Date End Date Taking? Authorizing Provider  acetaminophen (TYLENOL) 325 MG tablet Take 650 mg every 6 (six) hours as needed by mouth for headache.    [provider]  ALBUTEROL IN Inhale into the lungs.    [provider]  hydrOXYzine (ATARAX/VISTARIL) 25 MG tablet Take 1 tablet (25 mg total) by mouth at bedtime as needed. 08/16/19   Lestine Box, PA-C    Family History Family History  Problem Relation Age of Onset  . Breast cancer Mother 45       negative BRCA testing in 2010  . Thyroid  cancer Mother 59       papillary type  . Prostate cancer Father        dx. 63 or younger  . Prostate cancer Paternal Grandfather        lim info  . Lung cancer Maternal Grandmother 67       was a smoker  . Other Maternal Aunt        potentially also had genetic testing  . Cervical cancer Paternal Grandmother   . Breast cancer Other        dx. 15s    Social History Social History   Tobacco Use  . Smoking status: Current Every Day Smoker    Packs/day: 0.50    Years: 2.00    Pack years: 1.00    Types: Cigarettes  . Smokeless tobacco: Never Used  Substance Use Topics  . Alcohol use: Yes    Alcohol/week: 0.0 standard drinks  . Drug use: No     Allergies   Patient has no known allergies.   Review of Systems Review of Systems  All other systems reviewed and are  negative.    Physical Exam Updated Vital Signs BP 131/90 (BP Location: Right Arm)   Pulse 97   Temp 98.7 F (37.1 C) (Oral)   Resp 16   Ht _0  (1.6 m)   Wt 81.6 kg   SpO2 100%   BMI 31.89 kg/m   Physical Exam Vitals signs and nursing note reviewed.  Constitutional:      General: She is not in acute distress.    Appearance: She is well-developed. She is not diaphoretic.  HENT:     Head: Normocephalic and atraumatic.  Eyes:     Extraocular Movements: Extraocular movements intact.     Pupils: Pupils are equal, round, and reactive to light.  Neck:     Musculoskeletal: Normal range of motion and neck supple.  Cardiovascular:     Rate and Rhythm: Normal rate and regular rhythm.     Heart sounds: No murmur. No friction rub. No gallop.   Pulmonary:     Effort: Pulmonary effort is normal. No respiratory distress.     Breath sounds: Normal breath sounds. No wheezing.  Abdominal:     General: Bowel sounds are normal. There is no distension.     Palpations: Abdomen is soft.     Tenderness: There is no abdominal tenderness.  Musculoskeletal: Normal range of motion.  Skin:    General: Skin is warm  and dry.  Neurological:     Mental Status: She is alert and oriented to person, place, and time.     Cranial Nerves: No cranial nerve deficit.     Sensory: No sensory deficit.     Motor: Weakness present.     Coordination: Coordination normal.      ED Treatments / Results  Labs (all labs ordered are listed, but only abnormal results are displayed) Labs Reviewed  SARS CORONAVIRUS 2 (TAT 6-24 HRS)    EKG None  Radiology No results found.  Procedures Procedures (including critical care time)  Medications Ordered in ED Medications - No data to display   Initial Impression / Assessment and Plan / ED Course  I have reviewed the triage vital signs and the nursing notes.  Pertinent labs & imaging results that were available during my care of the patient were reviewed by me and considered in my medical decision making (see chart for details).  Patient presents with multiple complaints that seem viral in nature.  Patient will be treated with rotating Tylenol and ibuprofen, plenty of fluids, and follow-up as needed.  A Covid test will be obtained and patient will be discharged.  Work excuse given at her request.  Final Clinical Impressions(s) / ED Diagnoses   Final diagnoses:  None    ED Discharge Orders    None       Veryl Speak, MD 11/06/19 2045

## 2019-11-06 NOTE — Discharge Instructions (Signed)
Tylenol 1000 mg rotated with ibuprofen 600 mg every 4 hours as needed for pain.  Drink plenty of fluids and get plenty of rest.  Quarantine at home until the results of your Covid test are known.  This should be within the next 24 hours.  Return to the ER if symptoms significantly worsen or change.      Person Under Monitoring Name: Becky Scott  Location: [place] [address]   Infection Prevention Recommendations for Individuals Confirmed to have, or Being Evaluated for, 2019 Novel Coronavirus (COVID-19) Infection Who Receive Care at Home  Individuals who are confirmed to have, or are being evaluated for, COVID-19 should follow the prevention steps below until a healthcare provider or local or state health department says they can return to normal activities.  Stay home except to get medical care You should restrict activities outside your home, except for getting medical care. Do not go to work, school, or public areas, and do not use public transportation or taxis.  Call ahead before visiting your doctor Before your medical appointment, call the healthcare provider and tell them that you have, or are being evaluated for, COVID-19 infection. This will help the healthcare providers office take steps to keep other people from getting infected. Ask your healthcare provider to call the local or state health department.  Monitor your symptoms Seek prompt medical attention if your illness is worsening (e.g., difficulty breathing). Before going to your medical appointment, call the healthcare provider and tell them that you have, or are being evaluated for, COVID-19 infection. Ask your healthcare provider to call the local or state health department.  Wear a facemask You should wear a facemask that covers your nose and mouth when you are in the same room with other people and when you visit a healthcare provider. People who live with or visit you should also  wear a facemask while they are in the same room with you.  Separate yourself from other people in your home As much as possible, you should stay in a different room from other people in your home. Also, you should use a separate bathroom, if available.  Avoid sharing household items You should not share dishes, drinking glasses, cups, eating utensils, towels, bedding, or other items with other people in your home. After using these items, you should wash them thoroughly with soap and water.  Cover your coughs and sneezes Cover your mouth and nose with a tissue when you cough or sneeze, or you can cough or sneeze into your sleeve. Throw used tissues in a lined trash can, and immediately wash your hands with soap and water for at least 20 seconds or use an alcohol-based hand rub.  Wash your [facility] your hands often and thoroughly with soap and water for at least 20 seconds. You can use an alcohol-based hand sanitizer if soap and water are not available and if your hands are not visibly dirty. Avoid touching your eyes, nose, and mouth with unwashed hands.   Prevention Steps for Caregivers and Household Members of Individuals Confirmed to have, or Being Evaluated for, COVID-19 Infection Being Cared for in the Home  If you live with, or provide care at home for, a person confirmed to have, or being evaluated for, COVID-19 infection please follow these guidelines to prevent infection:  Follow healthcare providers instructions Make sure that you understand and can help the patient follow any healthcare provider instructions for all care.  Provide for the patients basic  needs You should help the patient with basic needs in the home and provide support for getting groceries, prescriptions, and other personal needs.  Monitor the patients symptoms If they are getting sicker, call his or her medical provider and tell them that the patient has, or is being evaluated for, COVID-19  infection. This will help the healthcare providers office take steps to keep other people from getting infected. Ask the healthcare provider to call the local or state health department.  Limit the number of people who have contact with the patient If possible, have only one caregiver for the patient. Other household members should stay in another home or place of residence. If this is not possible, they should stay in another room, or be separated from the patient as much as possible. Use a separate bathroom, if available. Restrict visitors who do not have an essential need to be in the home.  Keep older adults, very young children, and other sick people away from the patient Keep older adults, very young children, and those who have compromised immune systems or chronic health conditions away from the patient. This includes people with chronic heart, lung, or kidney conditions, diabetes, and cancer.  Ensure good ventilation Make sure that shared spaces in the home have good air flow, such as from an air conditioner or an opened window, weather permitting.  Wash your hands often Wash your hands often and thoroughly with soap and water for at least 20 seconds. You can use an alcohol based hand sanitizer if soap and water are not available and if your hands are not visibly dirty. Avoid touching your eyes, nose, and mouth with unwashed hands. Use disposable paper towels to dry your hands. If not available, use dedicated cloth towels and replace them when they become wet.  Wear a facemask and gloves Wear a disposable facemask at all times in the room and gloves when you touch or have contact with the patients blood, body fluids, and/or secretions or excretions, such as sweat, saliva, sputum, nasal mucus, vomit, urine, or feces.  Ensure the mask fits over your nose and mouth tightly, and do not touch it during use. Throw out disposable facemasks and gloves after using them. Do not reuse. Wash  your hands immediately after removing your facemask and gloves. If your personal clothing becomes contaminated, carefully remove clothing and launder. Wash your hands after handling contaminated clothing. Place all used disposable facemasks, gloves, and other waste in a lined container before disposing them with other household waste. Remove gloves and wash your hands immediately after handling these items.  Do not share dishes, glasses, or other household items with the patient Avoid sharing household items. You should not share dishes, drinking glasses, cups, eating utensils, towels, bedding, or other items with a patient who is confirmed to have, or being evaluated for, COVID-19 infection. After the person uses these items, you should wash them thoroughly with soap and water.  Wash laundry thoroughly Immediately remove and wash clothes or bedding that have blood, body fluids, and/or secretions or excretions, such as sweat, saliva, sputum, nasal mucus, vomit, urine, or feces, on them. Wear gloves when handling laundry from the patient. Read and follow directions on labels of laundry or clothing items and detergent. In general, wash and dry with the warmest temperatures recommended on the label.  Clean all areas the individual has used often Clean all touchable surfaces, such as counters, tabletops, doorknobs, bathroom fixtures, toilets, phones, keyboards, tablets, and bedside tables, every day.  Also, clean any surfaces that may have blood, body fluids, and/or secretions or excretions on them. Wear gloves when cleaning surfaces the patient has come in contact with. Use a diluted bleach solution (e.g., dilute bleach with 1 part bleach and 10 parts water) or a household disinfectant with a label that says EPA-registered for coronaviruses. To make a bleach solution at home, add 1 tablespoon of bleach to 1 quart (4 cups) of water. For a larger supply, add  cup of bleach to 1 gallon (16 cups) of  water. Read labels of cleaning products and follow recommendations provided on product labels. Labels contain instructions for safe and effective use of the cleaning product including precautions you should take when applying the product, such as wearing gloves or eye protection and making sure you have good ventilation during use of the product. Remove gloves and wash hands immediately after cleaning.  Monitor yourself for signs and symptoms of illness Caregivers and household members are considered close contacts, should monitor their health, and will be asked to limit movement outside of the home to the extent possible. Follow the monitoring steps for close contacts listed on the symptom monitoring form.   ? If you have additional questions, contact your local health department or call the epidemiologist on call at [phone] (available 24/7). ? This guidance is subject to change. For the most up-to-date guidance from [facility], please refer to their website: TripMetro.huhttps://www.cdc.gov/coronavirus/2019-ncov/hcp/guidance-prevent-spread.html

## 2019-11-06 NOTE — ED Triage Notes (Signed)
Patient c/o body aches since yesterday and states she has chills, and a headache today.

## 2019-11-07 LAB — SARS CORONAVIRUS 2 (TAT 6-24 HRS): SARS Coronavirus 2: NEGATIVE

## 2019-11-08 ENCOUNTER — Telehealth: Payer: Self-pay | Admitting: *Deleted

## 2019-11-08 NOTE — Telephone Encounter (Signed)
Patient was given NEGATIVE COVID results 

## 2020-02-06 IMAGING — DX CHEST - 2 VIEW
2 series · 2 of 2 positions shown · non-contrast
Comparison: Chest radiograph dated 02/14/2019

CLINICAL DATA: 28-year-old female with shortness of breath.

EXAM:
CHEST - 2 VIEW

[chest pa]
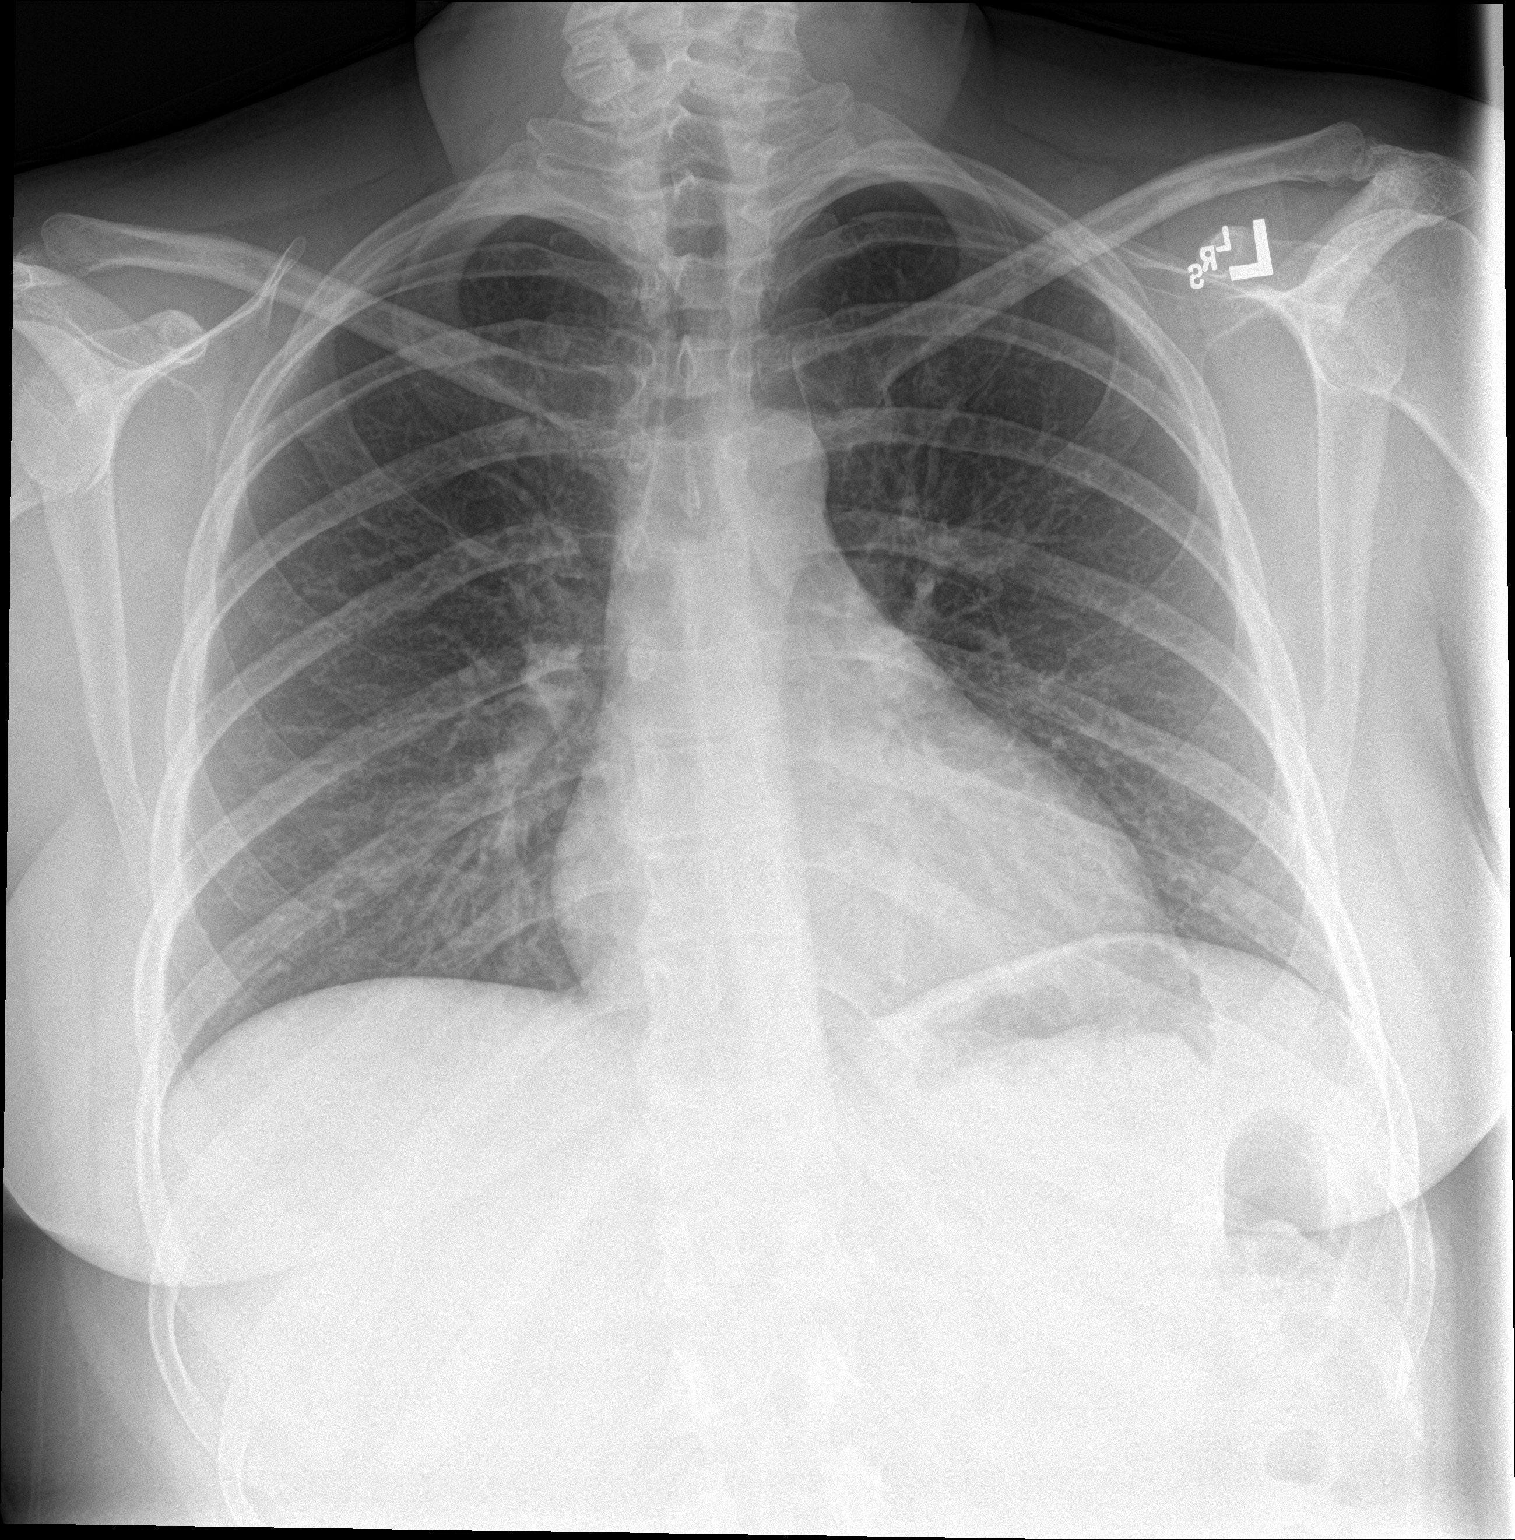

[chest lat]
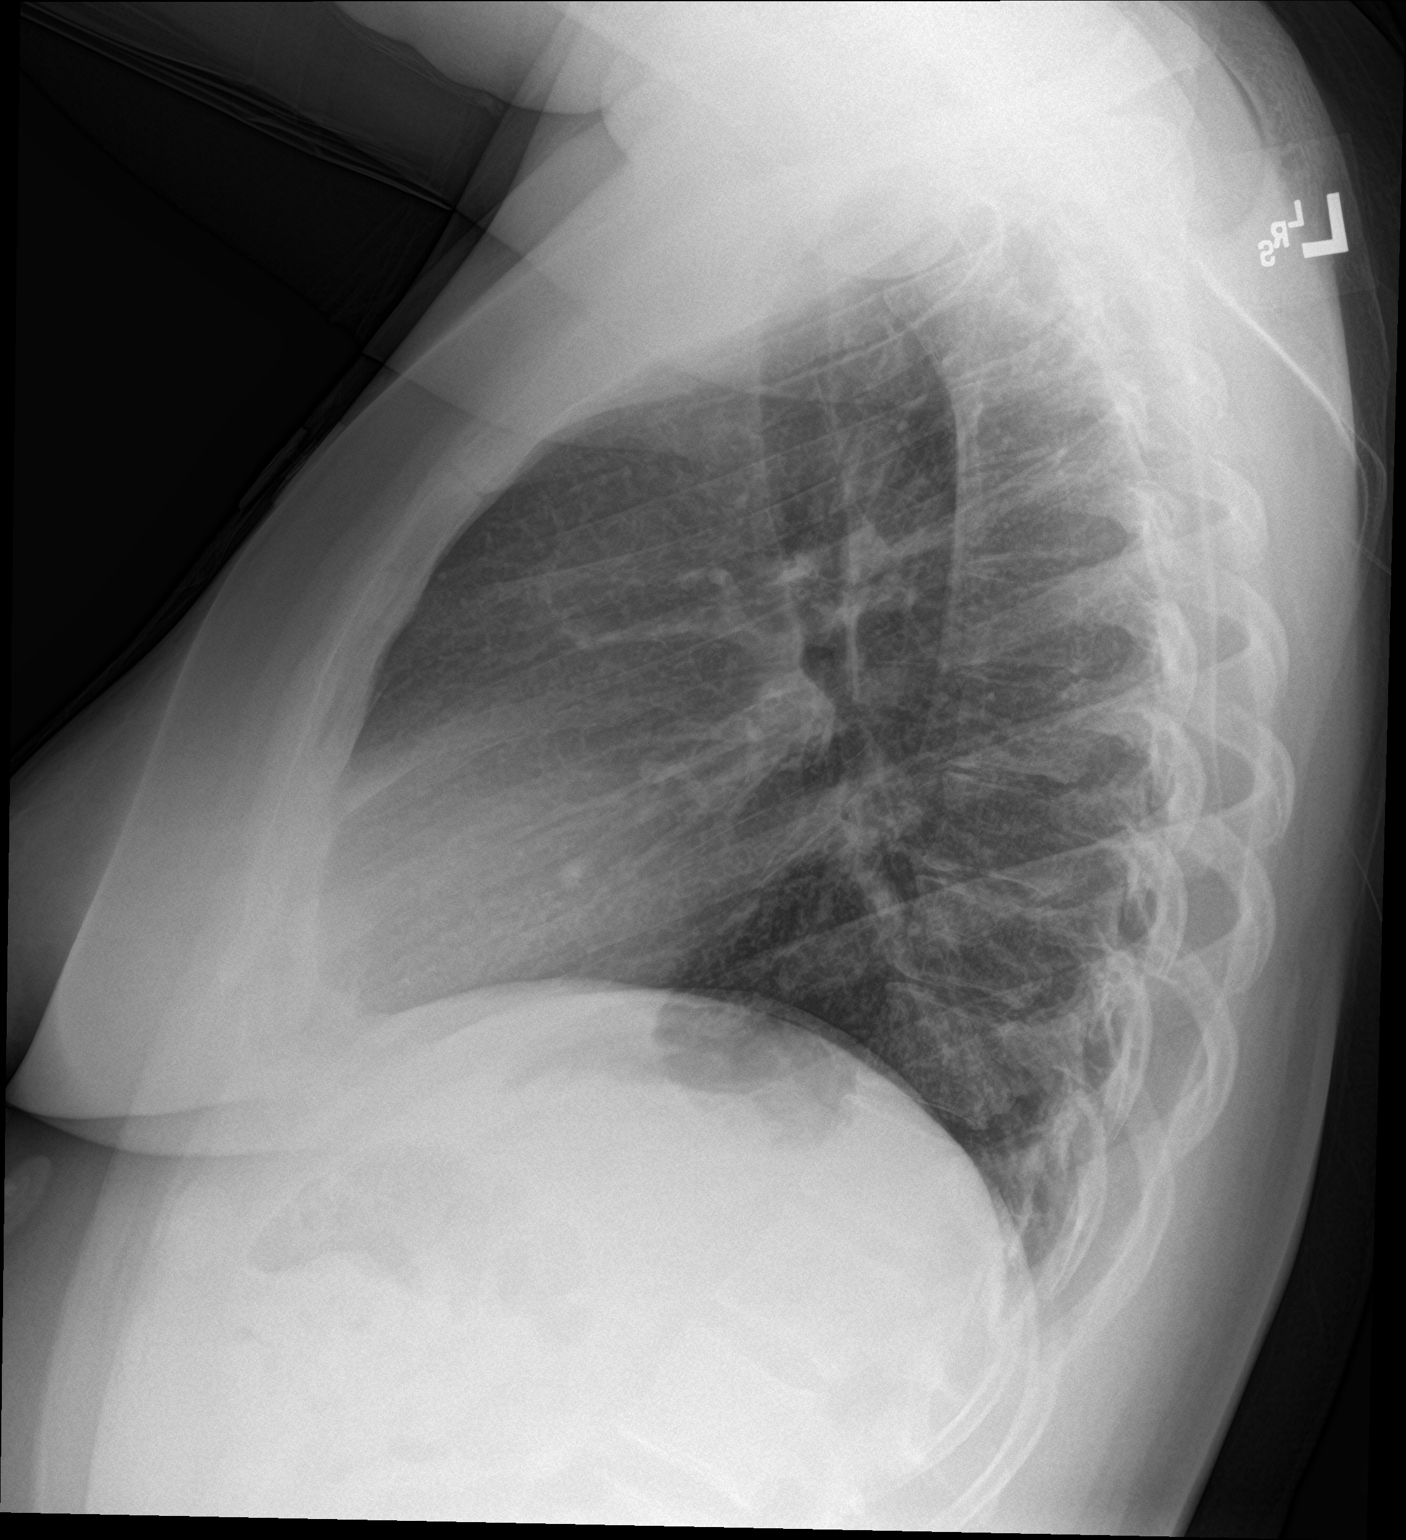

[2 of 2 positions shown; findings below may reference images not displayed]

FINDINGS: The heart size and mediastinal contours are within normal limits.
Both lungs are clear. The visualized skeletal structures are
unremarkable.
IMPRESSION: No active cardiopulmonary disease.

## 2020-05-02 ENCOUNTER — Other Ambulatory Visit: Payer: Self-pay

## 2020-05-02 ENCOUNTER — Ambulatory Visit (HOSPITAL_COMMUNITY)
Admission: EM | Admit: 2020-05-02 | Discharge: 2020-05-02 | Disposition: A | Discharge status: Home / Self Care | Payer: 59 | Attending: Family Medicine | Admitting: Family Medicine

## 2020-05-02 ENCOUNTER — Encounter (HOSPITAL_COMMUNITY): Payer: Self-pay

## 2020-05-02 DIAGNOSIS — J111 Influenza due to unidentified influenza virus with other respiratory manifestations: Secondary | ICD-10-CM

## 2020-05-02 DIAGNOSIS — Z20822 Contact with and (suspected) exposure to covid-19: Secondary | ICD-10-CM

## 2020-05-02 LAB — POCT RAPID STREP A: Streptococcus, Group A Screen (Direct): NEGATIVE

## 2020-05-02 MED ORDER — ALBUTEROL SULFATE HFA 108 (90 BASE) MCG/ACT IN AERS: 1.0000 | INHALATION_SPRAY | Freq: Four times a day (QID) | RESPIRATORY_TRACT | 0 refills | Status: DC | PRN

## 2020-05-02 NOTE — ED Provider Notes (Signed)
[place]    CSN: [id number] Arrival date & time: 05/02/20  1540      History   Chief Complaint Chief Complaint  Patient presents with  . Generalized Body Aches  . Fever    HPI Becky Scott is a 29 y.o. female.   HPI  Patient had some body aches couple days ago.  Sore throat and cough started today.  Today her temperature went up to 103 degrees.  She has been taking Tylenol alternating with ibuprofen to keep the fever down.  Feels very tired and achy.  Throat is painful to swallow.  Headache.  She has not lost her sense of taste or smell.  She does have some nasal congestion.  No one at home has been sick.  She works at [employer] and is unaware of any exposure.  Lives at home with her mother and child.  No one at home is sick. Patient has underlying allergies and asthma.  Requests a refill of her albuterol inhaler  Past Medical History:  Diagnosis Date  . Allergy   . Asthma   . Trichomonas     Patient Active Problem List   Diagnosis Date Noted  . Severe obesity (BMI >= 40) ([place]) 03/30/2016  . Family history of breast cancer in mother 08/19/2015  . Family history of thyroid cancer 08/19/2015  . Routine general medical examination at a health care facility 05/21/2015  . Mild intermittent asthma without complication 38/17/7116    Past Surgical History:  Procedure Laterality Date  . NO PAST SURGERIES      OB History    Gravida  2   Para  1   Term  1   Preterm      AB  1   Living  1     SAB  1   TAB      Ectopic      Multiple      Live Births  1            Home Medications    Prior to Admission medications   Medication Sig Start Date End Date Taking? Authorizing Provider  acetaminophen (TYLENOL) 325 MG tablet Take 650 mg every 6 (six) hours as needed by mouth for headache.   Yes [provider]  loratadine (CLARITIN) 10 MG tablet Take 10 mg by mouth daily.   Yes [provider]  albuterol (VENTOLIN HFA) 108 (90  Base) MCG/ACT inhaler Inhale 1-2 puffs into the lungs every 6 (six) hours as needed for wheezing or shortness of breath. 05/02/20   Raylene Everts, MD  hydrOXYzine (ATARAX/VISTARIL) 25 MG tablet Take 1 tablet (25 mg total) by mouth at bedtime as needed. 08/16/19   Wurst, [place], PA-C  ALBUTEROL IN Inhale into the lungs.  05/02/20  [provider]    Family History Family History  Problem Relation Age of Onset  . Breast cancer Mother 23       negative BRCA testing in 2010  . Thyroid cancer Mother 36       papillary type  . Prostate cancer Father        dx. 31 or younger  . Prostate cancer Paternal Grandfather        lim info  . Lung cancer Maternal Grandmother 67       was a smoker  . Other Maternal Aunt        potentially also had genetic testing  . Cervical cancer Paternal Grandmother   . Breast  cancer Other        dx. 70s    Social History Social History   Tobacco Use  . Smoking status: Current Every Day Smoker    Packs/day: 0.50    Years: 2.00    Pack years: 1.00    Types: Cigarettes  . Smokeless tobacco: Never Used  Substance Use Topics  . Alcohol use: Yes    Alcohol/week: 0.0 standard drinks  . Drug use: No     Allergies   Patient has no known allergies.   Review of Systems Review of Systems  Constitutional: Positive for appetite change, chills, fatigue and fever.  HENT: Positive for congestion, rhinorrhea and sore throat.   Respiratory: Negative for cough and shortness of breath.   Gastrointestinal: Negative for nausea and vomiting.  Musculoskeletal: Positive for myalgias.  Neurological: Positive for headaches.     Physical Exam Triage Vital Signs ED Triage Vitals  Enc Vitals Group     BP 05/02/20 1708 125/63     Pulse Rate 05/02/20 1708 78     Resp 05/02/20 1708 18     Temp 05/02/20 1708 98 F (36.7 C)     Temp Source 05/02/20 1708 Oral     SpO2 05/02/20 1708 97 %     Weight --      Height --      Head Circumference --       Peak Flow --      Pain Score 05/02/20 1703 8     Pain Loc --      Pain Edu? --      Excl. in [place]? --    No data found.  Updated Vital Signs BP 125/63 (BP Location: Left Arm)   Pulse 78   Temp 98 F (36.7 C) (Oral)   Resp 18   LMP 04/15/2020 (Approximate)   SpO2 97%      Physical Exam Constitutional:      General: She is not in acute distress.    Appearance: She is well-developed.     Comments: overweight  HENT:     Head: Normocephalic and atraumatic.     Right Ear: Tympanic membrane and ear canal normal.     Left Ear: Tympanic membrane and ear canal normal.     Nose: Congestion present.     Mouth/Throat:     Mouth: Mucous membranes are moist.     Pharynx: Posterior oropharyngeal erythema present.  Eyes:     Conjunctiva/sclera: Conjunctivae normal.     Pupils: Pupils are equal, round, and reactive to light.  Cardiovascular:     Rate and Rhythm: Normal rate and regular rhythm.     Heart sounds: Normal heart sounds.  Pulmonary:     Effort: Pulmonary effort is normal. No respiratory distress.     Breath sounds: Normal breath sounds. No wheezing, rhonchi or rales.  Musculoskeletal:        General: Normal range of motion.     Cervical back: Normal range of motion and neck supple.  Lymphadenopathy:     Cervical: No cervical adenopathy.  Skin:    General: Skin is warm and dry.  Neurological:     General: No focal deficit present.     Mental Status: She is alert.  Psychiatric:        Mood and Affect: Mood normal.        Behavior: Behavior normal.      UC Treatments / Results  Labs (all labs ordered are listed, but only  abnormal results are displayed) Labs Reviewed  SARS CORONAVIRUS 2 (TAT 6-24 HRS)  CULTURE, GROUP A STREP [facility])  POCT RAPID STREP A    EKG   Radiology No results found.  Procedures Procedures (including critical care time)  Medications Ordered in UC Medications - No data to display  Initial Impression / Assessment and Plan / UC Course   I have reviewed the triage vital signs and the nursing notes.  Pertinent labs & imaging results that were available during my care of the patient were reviewed by me and considered in my medical decision making (see chart for details).     Rapid strep is negative Covid is pending Discussed quarantine Treatment at home Call for questions Final Clinical Impressions(s) / UC Diagnoses   Final diagnoses:  Influenza-like illness  Suspected COVID-19 virus infection     Discharge Instructions     Go home to rest Drink plenty of fluids Take Tylenol or ibuprofen for pain or fever You may take over-the-counter cough and cold medicines as needed You must quarantine at home until your test result is available You can check for your test result in MyChart CALL for problems or questions, we will extend work note if needed     ED Prescriptions    Medication Sig Dispense Auth. Provider   albuterol (VENTOLIN HFA) 108 (90 Base) MCG/ACT inhaler Inhale 1-2 puffs into the lungs every 6 (six) hours as needed for wheezing or shortness of breath. 18 g Raylene Everts, MD     PDMP not reviewed this encounter.   Raylene Everts, MD 05/02/20 Johnnye Lana

## 2020-05-02 NOTE — Discharge Instructions (Addendum)
Go home to rest Drink plenty of fluids Take Tylenol or ibuprofen for pain or fever You may take over-the-counter cough and cold medicines as needed You must quarantine at home until your test result is available You can check for your test result in MyChart CALL for problems or questions, we will extend work note if needed

## 2020-05-02 NOTE — ED Triage Notes (Signed)
Pt c/o fever onset yesterday with Tmax of 103. Taking tylenol/aleve alternately tylenol last taken at 1430 today.  Also c/o body aches three days ago onset; sore throat, cough today. Also c/o HA for past three days, congestion, runny nose.  States she is out of her inhaler.

## 2020-05-03 LAB — SARS CORONAVIRUS 2 (TAT 6-24 HRS): SARS Coronavirus 2: NEGATIVE

## 2020-05-04 LAB — CULTURE, GROUP A STREP (THRC)

## 2020-06-13 ENCOUNTER — Encounter (HOSPITAL_COMMUNITY): Payer: Self-pay

## 2020-06-13 ENCOUNTER — Ambulatory Visit (HOSPITAL_COMMUNITY)
Admission: EM | Admit: 2020-06-13 | Discharge: 2020-06-13 | Disposition: A | Discharge status: Home / Self Care | Payer: Self-pay | Attending: Emergency Medicine | Admitting: Emergency Medicine

## 2020-06-13 ENCOUNTER — Other Ambulatory Visit: Payer: Self-pay

## 2020-06-13 DIAGNOSIS — R300 Dysuria: Secondary | ICD-10-CM | POA: Insufficient documentation

## 2020-06-13 DIAGNOSIS — N390 Urinary tract infection, site not specified: Secondary | ICD-10-CM

## 2020-06-13 DIAGNOSIS — Z3202 Encounter for pregnancy test, result negative: Secondary | ICD-10-CM

## 2020-06-13 LAB — POCT URINALYSIS DIP (DEVICE)
Glucose, UA: NEGATIVE mg/dL
Ketones, ur: NEGATIVE mg/dL
Nitrite: NEGATIVE
Protein, ur: 30 mg/dL — AB
Specific Gravity, Urine: >=1.03 (ref 1.005–1.030)
Urobilinogen, UA: 0.2 mg/dL (ref 0.0–1.0)
pH: 5.5 (ref 5.0–8.0)

## 2020-06-13 LAB — POC URINE PREG, ED: Preg Test, Ur: NEGATIVE

## 2020-06-13 MED ORDER — FLUCONAZOLE 150 MG PO TABS: 150.0000 mg | ORAL_TABLET | Freq: Once | ORAL | 0 refills | Status: AC

## 2020-06-13 MED ORDER — CEPHALEXIN 500 MG PO CAPS: 500.0000 mg | ORAL_CAPSULE | Freq: Two times a day (BID) | ORAL | 0 refills | Status: AC

## 2020-06-13 NOTE — ED Provider Notes (Signed)
[place]    CSN: [id number] Arrival date & time: 06/13/20  1612      History   Chief Complaint Chief Complaint  Patient presents with  . Urinary Tract Infection    HPI Becky Scott is a 29 y.o. female presenting today for evaluation of possible UTI.  Patient reports that beginning this morning she began to have some dysuria, itching and burning.  Symptoms mainly with urination.  She denies any vaginal discharge.  Denies urinary frequency, urgency or incomplete voiding.  Started menstrual cycle 2 days ago.  Is not on birth control.  Denies currently being sexually active.  Denies abdominal pain nausea or vomiting.  Oral intake like normal.  Has had prior UTI, yeast and BV, cannot recall if symptoms more similar to one or the other.  HPI  Past Medical History:  Diagnosis Date  . Allergy   . Asthma   . Trichomonas     Patient Active Problem List   Diagnosis Date Noted  . Severe obesity (BMI >= 40) ([place]) 03/30/2016  . Family history of breast cancer in mother 08/19/2015  . Family history of thyroid cancer 08/19/2015  . Routine general medical examination at a health care facility 05/21/2015  . Mild intermittent asthma without complication 90/24/0973    Past Surgical History:  Procedure Laterality Date  . NO PAST SURGERIES      OB History    Gravida  2   Para  1   Term  1   Preterm      AB  1   Living  1     SAB  1   TAB      Ectopic      Multiple      Live Births  1            Home Medications    Prior to Admission medications   Medication Sig Start Date End Date Taking? Authorizing Provider  acetaminophen (TYLENOL) 325 MG tablet Take 650 mg every 6 (six) hours as needed by mouth for headache.    [provider]  albuterol (VENTOLIN HFA) 108 (90 Base) MCG/ACT inhaler Inhale 1-2 puffs into the lungs every 6 (six) hours as needed for wheezing or shortness of breath. 05/02/20   Raylene Everts, MD  cephALEXin (KEFLEX)  500 MG capsule Take 1 capsule (500 mg total) by mouth 2 (two) times daily for 7 days. 06/13/20 06/20/20  Keric Zehren C, PA-C  fluconazole (DIFLUCAN) 150 MG tablet Take 1 tablet (150 mg total) by mouth once for 1 dose. 06/13/20 06/13/20  Tracey Hermance C, PA-C  hydrOXYzine (ATARAX/VISTARIL) 25 MG tablet Take 1 tablet (25 mg total) by mouth at bedtime as needed. 08/16/19   Wurst, [place], PA-C  loratadine (CLARITIN) 10 MG tablet Take 10 mg by mouth daily.    [provider]  ALBUTEROL IN Inhale into the lungs.  05/02/20  [provider]    Family History Family History  Problem Relation Age of Onset  . Breast cancer Mother 22       negative BRCA testing in 2010  . Thyroid cancer Mother 60       papillary type  . Prostate cancer Father        dx. 46 or younger  . Prostate cancer Paternal Grandfather        lim info  . Lung cancer Maternal Grandmother 67       was a smoker  . Other Maternal Aunt  potentially also had genetic testing  . Cervical cancer Paternal Grandmother   . Breast cancer Other        dx. 3s    Social History Social History   Tobacco Use  . Smoking status: Current Every Day Smoker    Packs/day: 0.50    Years: 2.00    Pack years: 1.00    Types: Cigarettes  . Smokeless tobacco: Never Used  Vaping Use  . Vaping Use: Never used  Substance Use Topics  . Alcohol use: Yes    Alcohol/week: 0.0 standard drinks  . Drug use: No     Allergies   Patient has no known allergies.   Review of Systems Review of Systems  Constitutional: Negative for fever.  Respiratory: Negative for shortness of breath.   Cardiovascular: Negative for chest pain.  Gastrointestinal: Negative for abdominal pain, diarrhea, nausea and vomiting.  Genitourinary: Positive for dysuria. Negative for flank pain, genital sores, hematuria, menstrual problem, vaginal bleeding, vaginal discharge and vaginal pain.  Musculoskeletal: Negative for back pain.  Skin: Negative  for rash.  Neurological: Negative for dizziness, light-headedness and headaches.     Physical Exam Triage Vital Signs ED Triage Vitals  Enc Vitals Group     BP 06/13/20 1647 114/62     Pulse Rate 06/13/20 1647 76     Resp 06/13/20 1647 18     Temp 06/13/20 1647 98.2 F (36.8 C)     Temp Source 06/13/20 1647 Oral     SpO2 06/13/20 1647 100 %     Weight --      Height --      Head Circumference --      Peak Flow --      Pain Score 06/13/20 1645 0     Pain Loc --      Pain Edu? --      Excl. in [place]? --    No data found.  Updated Vital Signs BP 114/62 (BP Location: Right Arm)   Pulse 76   Temp 98.2 F (36.8 C) (Oral)   Resp 18   LMP 06/11/2020   SpO2 100%   Visual Acuity Right Eye Distance:   Left Eye Distance:   Bilateral Distance:    Right Eye Near:   Left Eye Near:    Bilateral Near:     Physical Exam   UC Treatments / Results  Labs (all labs ordered are listed, but only abnormal results are displayed) Labs Reviewed  POCT URINALYSIS DIP (DEVICE) - Abnormal; Notable for the following components:      Result Value   Bilirubin Urine SMALL (*)    Hgb urine dipstick LARGE (*)    Protein, ur 30 (*)    Leukocytes,Ua SMALL (*)    All other components within normal limits  URINE CULTURE  POC URINE PREG, ED  CERVICOVAGINAL ANCILLARY ONLY    EKG   Radiology No results found.  Procedures Procedures (including critical care time)  Medications Ordered in UC Medications - No data to display  Initial Impression / Assessment and Plan / UC Course  I have reviewed the triage vital signs and the nursing notes.  Pertinent labs & imaging results that were available during my care of the patient were reviewed by me and considered in my medical decision making (see chart for details).     UA with small leuks, urine culture pending.  Empirically treating for UTI and yeast today with Keflex and Diflucan.  Swab pending.  Will alter  treatment based off results.   Push fluids.  Discussed strict return precautions. Patient verbalized understanding and is agreeable with plan.   Final Clinical Impressions(s) / UC Diagnoses   Final diagnoses:  Dysuria     Discharge Instructions     Urine was suggestive of UTI-begin Keflex twice daily for 1 week, drink plenty of fluids, will send for culture to confirm, I will call if needing to change any medicine  Vaginal swab pending as well, I sent in Diflucan to go ahead and treat for yeast, take 1 tablet today, may repeat second tablet after finishing antibiotic for UTI  Please follow-up if any symptoms not improving or worsening    ED Prescriptions    Medication Sig Dispense Auth. Provider   cephALEXin (KEFLEX) 500 MG capsule Take 1 capsule (500 mg total) by mouth 2 (two) times daily for 7 days. 14 capsule Daveon Arpino C, PA-C   fluconazole (DIFLUCAN) 150 MG tablet Take 1 tablet (150 mg total) by mouth once for 1 dose. 2 tablet Zadrian Mccauley, [place] C, PA-C     PDMP not reviewed this encounter.   Janith Lima, [place] 06/13/20 1949

## 2020-06-13 NOTE — Discharge Instructions (Signed)
Urine was suggestive of UTI-begin Keflex twice daily for 1 week, drink plenty of fluids, will send for culture to confirm, I will call if needing to change any medicine  Vaginal swab pending as well, I sent in Diflucan to go ahead and treat for yeast, take 1 tablet today, may repeat second tablet after finishing antibiotic for UTI  Please follow-up if any symptoms not improving or worsening

## 2020-06-13 NOTE — ED Triage Notes (Signed)
Pt is here with vaginal itching, burning since this morning. Pt states it burns even when washing her vagina. Pt has not had unprotected sex since April, pt has not taken anything to relieve discomfort.

## 2020-06-14 LAB — URINE CULTURE: Culture: 10000 — AB

## 2020-06-16 ENCOUNTER — Telehealth (HOSPITAL_COMMUNITY): Payer: Self-pay | Admitting: Orthopedic Surgery

## 2020-06-16 LAB — CERVICOVAGINAL ANCILLARY ONLY
Bacterial Vaginitis (gardnerella): NEGATIVE
Candida Glabrata: NEGATIVE
Candida Vaginitis: NEGATIVE
Chlamydia: NEGATIVE
Comment: NEGATIVE
Comment: NEGATIVE
Comment: NEGATIVE
Comment: NEGATIVE
Comment: NEGATIVE
Comment: NORMAL
Neisseria Gonorrhea: NEGATIVE
Trichomonas: POSITIVE — AB

## 2020-06-16 MED ORDER — METRONIDAZOLE 500 MG PO TABS: 500.0000 mg | ORAL_TABLET | Freq: Two times a day (BID) | ORAL | 0 refills | Status: DC

## 2020-09-07 DIAGNOSIS — Z20822 Contact with and (suspected) exposure to covid-19: Secondary | ICD-10-CM | POA: Diagnosis not present

## 2020-09-10 ENCOUNTER — Encounter (HOSPITAL_COMMUNITY): Payer: Self-pay | Admitting: Obstetrics and Gynecology

## 2020-09-10 ENCOUNTER — Inpatient Hospital Stay (HOSPITAL_COMMUNITY)
Admission: AD | Admit: 2020-09-10 | Discharge: 2020-09-10 | Disposition: A | Discharge status: Home / Self Care | Payer: Medicaid Other | Attending: Obstetrics and Gynecology | Admitting: Obstetrics and Gynecology

## 2020-09-10 ENCOUNTER — Other Ambulatory Visit: Payer: Self-pay

## 2020-09-10 ENCOUNTER — Inpatient Hospital Stay (HOSPITAL_COMMUNITY): Payer: Medicaid Other

## 2020-09-10 DIAGNOSIS — Z3A01 Less than 8 weeks gestation of pregnancy: Secondary | ICD-10-CM | POA: Diagnosis not present

## 2020-09-10 DIAGNOSIS — Z79899 Other long term (current) drug therapy: Secondary | ICD-10-CM | POA: Insufficient documentation

## 2020-09-10 DIAGNOSIS — J45909 Unspecified asthma, uncomplicated: Secondary | ICD-10-CM | POA: Diagnosis not present

## 2020-09-10 DIAGNOSIS — O3680X0 Pregnancy with inconclusive fetal viability, not applicable or unspecified: Secondary | ICD-10-CM

## 2020-09-10 DIAGNOSIS — O99331 Smoking (tobacco) complicating pregnancy, first trimester: Secondary | ICD-10-CM | POA: Insufficient documentation

## 2020-09-10 DIAGNOSIS — O99511 Diseases of the respiratory system complicating pregnancy, first trimester: Secondary | ICD-10-CM | POA: Insufficient documentation

## 2020-09-10 DIAGNOSIS — O26891 Other specified pregnancy related conditions, first trimester: Secondary | ICD-10-CM | POA: Insufficient documentation

## 2020-09-10 DIAGNOSIS — F17210 Nicotine dependence, cigarettes, uncomplicated: Secondary | ICD-10-CM | POA: Diagnosis not present

## 2020-09-10 DIAGNOSIS — O26899 Other specified pregnancy related conditions, unspecified trimester: Secondary | ICD-10-CM

## 2020-09-10 LAB — URINALYSIS, ROUTINE W REFLEX MICROSCOPIC
Bilirubin Urine: NEGATIVE
Glucose, UA: NEGATIVE mg/dL
Hgb urine dipstick: NEGATIVE
Ketones, ur: NEGATIVE mg/dL
Leukocytes,Ua: NEGATIVE
Nitrite: NEGATIVE
Protein, ur: NEGATIVE mg/dL
Specific Gravity, Urine: 1.018 (ref 1.005–1.030)
pH: 5 (ref 5.0–8.0)

## 2020-09-10 LAB — WET PREP, GENITAL
Clue Cells Wet Prep HPF POC: NONE SEEN
Sperm: NONE SEEN
Trich, Wet Prep: NONE SEEN
Yeast Wet Prep HPF POC: NONE SEEN

## 2020-09-10 LAB — COMPREHENSIVE METABOLIC PANEL
ALT: 25 U/L (ref 0–44)
AST: 21 U/L (ref 15–41)
Albumin: 3.5 g/dL (ref 3.5–5.0)
Alkaline Phosphatase: 50 U/L (ref 38–126)
Anion gap: 7 (ref 5–15)
BUN: 9 mg/dL (ref 6–20)
CO2: 24 mmol/L (ref 22–32)
Calcium: 8.6 mg/dL — ABNORMAL LOW (ref 8.9–10.3)
Chloride: 105 mmol/L (ref 98–111)
Creatinine, Ser: 0.65 mg/dL (ref 0.44–1.00)
GFR calc Af Amer: >60 mL/min (ref >60)
GFR calc non Af Amer: >60 mL/min (ref >60)
Glucose, Bld: 85 mg/dL (ref 70–99)
Potassium: 4.6 mmol/L (ref 3.5–5.1)
Sodium: 136 mmol/L (ref 135–145)
Total Bilirubin: 1.1 mg/dL (ref 0.3–1.2)
Total Protein: 6.3 g/dL — ABNORMAL LOW (ref 6.5–8.1)

## 2020-09-10 LAB — HCG, QUANTITATIVE, PREGNANCY: hCG, Beta Chain, Quant, S: 1756 m[IU]/mL — ABNORMAL HIGH (ref <5)

## 2020-09-10 LAB — CBC
HCT: 38.4 % (ref 36.0–46.0)
Hemoglobin: 12.2 g/dL (ref 12.0–15.0)
MCH: 28.8 pg (ref 26.0–34.0)
MCHC: 31.8 g/dL (ref 30.0–36.0)
MCV: 90.8 fL (ref 80.0–100.0)
Platelets: 207 10*3/uL (ref 150–400)
RBC: 4.23 MIL/uL (ref 3.87–5.11)
RDW: 13.2 % (ref 11.5–15.5)
WBC: 7.9 10*3/uL (ref 4.0–10.5)
nRBC: 0 % (ref 0.0–0.2)

## 2020-09-10 LAB — POCT PREGNANCY, URINE: Preg Test, Ur: POSITIVE — AB

## 2020-09-10 NOTE — Discharge Instructions (Signed)
Human Chorionic Gonadotropin Test Why am I having this test? A human chorionic gonadotropin (hCG) test is done to determine whether you are pregnant. It can also be used:  To diagnose an abnormal pregnancy.  To determine whether you have had a failed pregnancy (miscarriage) or are at risk of one. What is being tested? This test checks the level of the human chorionic gonadotropin (hCG) hormone in the blood. This hormone is produced during pregnancy by the cells that form the placenta. The placenta is the organ that grows inside your womb (uterus) to nourish a developing baby. When you are pregnant, hCG can be detected in your blood or urine 7 to 8 days before your missed period. It continues to go up for the first 8-10 weeks of pregnancy. The presence of hCG in your blood can be measured with several different types of tests. You may have:  A urine test. ? Because this hormone is eliminated from your body by your kidneys, you may have a urine test to find out whether you are pregnant. A home pregnancy test detects whether there is hCG in your urine. ? A urine test only shows whether there is hCG in your urine. It does not measure how much.  A qualitative blood test. ? You may have this type of blood test to find out if you are pregnant. ? This blood test only shows whether there is hCG in your blood. It does not measure how much.  A quantitative blood test. ? This type of blood test measures the amount of hCG in your blood. ? You may have this test to:  Diagnose an abnormal pregnancy.  Check whether you have had a miscarriage.  Determine whether you are at risk of a miscarriage. What kind of sample is taken?     Two kinds of samples may be collected to test for the hCG hormone.  Blood. It is usually collected by inserting a needle into a blood vessel.  Urine. It is usually collected by urinating into a germ-free (sterile) specimen cup. It is best to collect the sample the first  time you urinate in the morning. How do I prepare for this test? No preparation is needed for a blood test.  For the urine test:  Let your health care provider know about: ? All medicines you are taking, including vitamins, herbs, creams, and over-the-counter medicines. ? Any blood in your urine. This may interfere with the result.  Do not drink too much fluid. Drink as you normally would, or as directed by your health care provider. How are the results reported? Depending on the type of test that you have, your test results may be reported as values. Your health care provider will compare your results to normal ranges that were established after testing a large group of people (reference ranges). Reference ranges may vary among labs and hospitals. For this test, common reference ranges that show absence of pregnancy are:  Quantitative hCG blood levels: less than 5 IU/L. Other results will be reported as either positive or negative. For this test, normal results (meaning the absence of pregnancy) are:  Negative for hCG in the urine test.  Negative for hCG in the qualitative blood test. What do the results mean? Urine and qualitative blood test  A negative result could mean: ? That you are not pregnant. ? That the test was done too early in your pregnancy to detect hCG in your blood or urine. If you still have other signs   of pregnancy, the test will be repeated.  A positive result means: ? That you are most likely pregnant. Your health care provider may confirm your pregnancy with an imaging study (ultrasound) of your uterus, if needed. Quantitative blood test Results of the quantitative hCG blood test will be interpreted as follows:  Less than 5 IU/L: You are most likely not pregnant.  Greater than 25 IU/L: You are most likely pregnant.  hCG levels that are higher than expected: ? You are pregnant with twins. ? You have abnormal growths in the uterus.  hCG levels that are  rising more slowly than expected: ? You have an ectopic pregnancy (also called a tubal pregnancy).  hCG levels that are falling: ? You may be having a miscarriage. Talk with your health care provider about what your results mean. Questions to ask your health care provider Ask your health care provider, or the department that is doing the test:  When will my results be ready?  How will I get my results?  What are my treatment options?  What other tests do I need?  What are my next steps? Summary  A human chorionic gonadotropin test is done to determine whether you are pregnant.  When you are pregnant, hCG can be detected in your blood or urine 7 to 8 days before your missed period. It continues to go up for the first 8-10 weeks of pregnancy.  Your hCG level can be measured with different types of tests. You may have a urine test, a qualitative blood test, or a quantitative blood test.  Talk with your health care provider about what your results mean. This information is not intended to replace advice given to you by your health care provider. Make sure you discuss any questions you have with your health care provider. Document Revised: 11/07/2017 Document Reviewed: 11/07/2017 Elsevier Patient Education  2020 [employer]  

## 2020-09-10 NOTE — MAU Note (Addendum)
Ws on her way to work, got a sudden sharp pain in lower abd, around to her back, not easing off, constant.  Feels like she is being stabbed all the way.  Has eased off some, from onset.  3 +HPT, confirmed on Mon @ [facility].

## 2020-09-10 NOTE — MAU Provider Note (Signed)
History     CSN: [id number]  Arrival date and time: 09/10/20 0751   None     Chief Complaint  Patient presents with  . Abdominal Pain   HPI Becky Scott is a 29 y.o. [name] at 41w2dby uncertain LMP who presents to MAU with chief complaint of abdominal pain. This is a new problem, onset today shortly before her arrival to MAU. Patient's pain is suprapubic, "sharp", rated as 8/10, and radiates laterally "like a belt" along her left side to her lower back. She denies aggravating or alleviating factors. She has not taken medication or tried other treatments for these complaints. She declines pain medication in MAU.  Patient denies vaginal bleeding, abnormal vaginal discharge, dysuria, fever or recent illness. She is remote from sexual intercourse.  Patient identifies two sexual partners, one female, one female. States they were all diagnosed with Trichomonas in June, treated simultaneously. Patient was the only member of the relationship who did not return for TOC.  Patient's pregnancy was confirmed by [facility] via UPT.  Patient states she has been NPO since 0500 today.  OB History    Gravida  3   Para  1   Term  1   Preterm      AB  1   Living  1     SAB  1   TAB      Ectopic      Multiple      Live Births  1           Past Medical History:  Diagnosis Date  . Allergy   . Asthma   . Trichomonas     Past Surgical History:  Procedure Laterality Date  . NO PAST SURGERIES      Family History  Problem Relation Age of Onset  . Breast cancer Mother 375      negative BRCA testing in 2010  . Thyroid cancer Mother 338      papillary type  . Prostate cancer Father        dx. 475or younger  . Prostate cancer Paternal Grandfather        lim info  . Lung cancer Maternal Grandmother 67       was a smoker  . Other Maternal Aunt        potentially also had genetic testing  . Cervical cancer Paternal Grandmother   . Breast cancer Other        dx.  259s   Social History   Tobacco Use  . Smoking status: Current Every Day Smoker    Packs/day: 0.50    Years: 2.00    Pack years: 1.00    Types: Cigarettes  . Smokeless tobacco: Never Used  Vaping Use  . Vaping Use: Never used  Substance Use Topics  . Alcohol use: Yes    Alcohol/week: 0.0 standard drinks  . Drug use: No    Allergies: Not on File  Medications Prior to Admission  Medication Sig Dispense Refill Last Dose  . acetaminophen (TYLENOL) 325 MG tablet Take 650 mg every 6 (six) hours as needed by mouth for headache.     . albuterol (VENTOLIN HFA) 108 (90 Base) MCG/ACT inhaler Inhale 1-2 puffs into the lungs every 6 (six) hours as needed for wheezing or shortness of breath. 18 g 0   . hydrOXYzine (ATARAX/VISTARIL) 25 MG tablet Take 1 tablet (25 mg total) by mouth at bedtime as needed. 12 tablet 0   .  loratadine (CLARITIN) 10 MG tablet Take 10 mg by mouth daily.     . metroNIDAZOLE (FLAGYL) 500 MG tablet Take 1 tablet (500 mg total) by mouth 2 (two) times daily. 14 tablet 0     Review of Systems  Gastrointestinal: Positive for abdominal pain.  Musculoskeletal: Positive for back pain.  All other systems reviewed and are negative.  Physical Exam   Blood pressure 129/65, pulse 77, temperature 98.9 F (37.2 C), temperature source Oral, resp. rate 20, height '5\' 3"'  (1.6 m), weight 93.7 kg, last menstrual period 08/04/2020, SpO2 100 %.  Physical Exam Vitals and nursing note reviewed. Exam conducted with a chaperone present.  Constitutional:      Appearance: She is well-developed. She is obese.  Cardiovascular:     Rate and Rhythm: Normal rate.     Heart sounds: Normal heart sounds.  Pulmonary:     Effort: Pulmonary effort is normal.     Breath sounds: Normal breath sounds.  Abdominal:     Palpations: Abdomen is soft.     Tenderness: There is abdominal tenderness. There is no right CVA tenderness, left CVA tenderness, guarding or rebound.  Skin:    General: Skin is  warm and dry.     Capillary Refill: Capillary refill takes less than 2 seconds.  Neurological:     General: No focal deficit present.     Mental Status: She is alert.  Psychiatric:        Mood and Affect: Mood normal.     MAU Course  Procedures  --Swabs collected via blind swab due to absence of vaginal bleeding --Hx Trichomonas 06/13/2020 with TOC --0910: CNM at bedside to discuss ultrasound results, indication for outpatient followup. Pt verbalizes understanding, again declines pain medicine  Orders Placed This Encounter  Procedures  . Wet prep, genital  . US OB LESS THAN 14 WEEKS WITH OB TRANSVAGINAL  . CBC  . hCG, quantitative, pregnancy  . Comprehensive metabolic panel  . Diet NPO time specified  . Nursing communication  . Pregnancy, urine POC   Results for orders placed or performed during the hospital encounter of 09/10/20 (from the past 24 hour(s))  Pregnancy, urine POC     Status: Abnormal   Collection Time: 09/10/20  8:13 AM  Result Value Ref Range   Preg Test, Ur POSITIVE (A) NEGATIVE  Wet prep, genital     Status: Abnormal   Collection Time: 09/10/20  8:35 AM   Specimen: PATH Cytology Cervicovaginal Ancillary Only  Result Value Ref Range   Yeast Wet Prep HPF POC NONE SEEN NONE SEEN   Trich, Wet Prep NONE SEEN NONE SEEN   Clue Cells Wet Prep HPF POC NONE SEEN NONE SEEN   WBC, Wet Prep HPF POC MANY (A) NONE SEEN   Sperm NONE SEEN   CBC     Status: None   Collection Time: 09/10/20  8:38 AM  Result Value Ref Range   WBC 7.9 4.0 - 10.5 K/uL   RBC 4.23 3.87 - 5.11 MIL/uL   Hemoglobin 12.2 12.0 - 15.0 g/dL   HCT 38.4 36 - 46 %   MCV 90.8 80.0 - 100.0 fL   MCH 28.8 26.0 - 34.0 pg   MCHC 31.8 30.0 - 36.0 g/dL   RDW 13.2 11.5 - 15.5 %   Platelets 207 150 - 400 K/uL   nRBC 0.0 0.0 - 0.2 %  hCG, quantitative, pregnancy     Status: Abnormal   Collection Time: 09/10/20  8:38  AM  Result Value Ref Range   hCG, Beta Chain, Quant, S 1,756 (H) <5 mIU/mL   Comprehensive metabolic panel     Status: Abnormal   Collection Time: 09/10/20  8:38 AM  Result Value Ref Range   Sodium 136 135 - 145 mmol/L   Potassium 4.6 3.5 - 5.1 mmol/L   Chloride 105 98 - 111 mmol/L   CO2 24 22 - 32 mmol/L   Glucose, Bld 85 70 - 99 mg/dL   BUN 9 6 - 20 mg/dL   Creatinine, Ser 0.65 0.44 - 1.00 mg/dL   Calcium 8.6 (L) 8.9 - 10.3 mg/dL   Total Protein 6.3 (L) 6.5 - 8.1 g/dL   Albumin 3.5 3.5 - 5.0 g/dL   AST 21 15 - 41 U/L   ALT 25 0 - 44 U/L   Alkaline Phosphatase 50 38 - 126 U/L   Total Bilirubin 1.1 0.3 - 1.2 mg/dL   GFR calc non Af Amer >60 >60 mL/min   GFR calc Af Amer >60 >60 mL/min   Anion gap 7 5 - 15  Urinalysis, Routine w reflex microscopic Urine, Clean Catch     Status: None   Collection Time: 09/10/20  8:45 AM  Result Value Ref Range   Color, Urine YELLOW YELLOW   APPearance CLEAR CLEAR   Specific Gravity, Urine 1.018 1.005 - 1.030   pH 5.0 5.0 - 8.0   Glucose, UA NEGATIVE NEGATIVE mg/dL   Hgb urine dipstick NEGATIVE NEGATIVE   Bilirubin Urine NEGATIVE NEGATIVE   Ketones, ur NEGATIVE NEGATIVE mg/dL   Protein, ur NEGATIVE NEGATIVE mg/dL   Nitrite NEGATIVE NEGATIVE   Leukocytes,Ua NEGATIVE NEGATIVE   US OB LESS THAN 14 WEEKS WITH OB TRANSVAGINAL  Result Date: 09/10/2020 CLINICAL DATA:  29 year old pregnant female presents with sharp lower abdominal pain today. Quantitative beta HCG pending. EDC by LMP: 05/11/2021, projecting to an expected gestational age of [redacted] weeks 2 days. EXAM: OBSTETRIC <14 WK [place] AND TRANSVAGINAL OB US TECHNIQUE: Both transabdominal and transvaginal ultrasound examinations were performed for complete evaluation of the gestation as well as the maternal uterus, adnexal regions, and pelvic cul-de-sac. Transvaginal technique was performed to assess early pregnancy. COMPARISON:  None. FINDINGS: Intrauterine gestational sac: Single intrauterine sac-like structure Yolk sac:  Not Visualized. Embryo:  Not Visualized. Cardiac Activity:  Not Visualized. MSD: 3.2 mm   5 w   0 d Subchorionic hemorrhage:  None visualized. Maternal uterus/adnexae: Anteverted uterus with no uterine fibroids. Right ovary measures 3.2 x 2.9 x 3.5 cm. Left ovary measures 2.3 x 1.6 x 2.6 cm. No abnormal ovarian or adnexal masses. IMPRESSION: Single intrauterine sac-like structure measuring 5 weeks 0 days by mean sac diameter, without definitive features of a pregnancy such as yolk sac or embryo identified at this time. No abnormal ovarian or adnexal masses. Sonographic differential diagnosis includes early intrauterine gestation, spontaneous abortion or occult ectopic gestation. Recommend close clinical follow-up and serial serum beta HCG monitoring, with repeat obstetric scan in 15-21 days or earlier as warranted by beta HCG levels and clinical assessment. Electronically Signed   By: Ilona Sorrel M.D.   On: 09/10/2020 09:01    Assessment and Plan  --29 y.o. G3P1011 with pregnancy of unknown location --Quant hCG 1756 --No concerning findings on physical exam --Pt declines pain medication  --Discharge home in stable condition  F/U: --Appt made for Stat Quant hCG 09/12/2020 at [facility] for [place], Rachel 09/10/2020, 2:39 PM

## 2020-09-11 LAB — GC/CHLAMYDIA PROBE AMP (~~LOC~~) NOT AT ARMC
Chlamydia: NEGATIVE
Comment: NEGATIVE
Comment: NORMAL
Neisseria Gonorrhea: NEGATIVE

## 2020-09-12 ENCOUNTER — Ambulatory Visit: Payer: Self-pay

## 2020-09-19 DIAGNOSIS — Z419 Encounter for procedure for purposes other than remedying health state, unspecified: Secondary | ICD-10-CM | POA: Diagnosis not present

## 2020-09-23 DIAGNOSIS — Z3201 Encounter for pregnancy test, result positive: Secondary | ICD-10-CM | POA: Diagnosis not present

## 2020-09-23 DIAGNOSIS — Z3490 Encounter for supervision of normal pregnancy, unspecified, unspecified trimester: Secondary | ICD-10-CM | POA: Diagnosis not present

## 2020-09-23 DIAGNOSIS — O219 Vomiting of pregnancy, unspecified: Secondary | ICD-10-CM | POA: Diagnosis not present

## 2020-09-23 DIAGNOSIS — N926 Irregular menstruation, unspecified: Secondary | ICD-10-CM | POA: Diagnosis not present

## 2020-09-30 DIAGNOSIS — N911 Secondary amenorrhea: Secondary | ICD-10-CM | POA: Diagnosis not present

## 2020-09-30 DIAGNOSIS — Z3201 Encounter for pregnancy test, result positive: Secondary | ICD-10-CM | POA: Diagnosis not present

## 2020-10-16 DIAGNOSIS — Z369 Encounter for antenatal screening, unspecified: Secondary | ICD-10-CM | POA: Diagnosis not present

## 2020-10-16 DIAGNOSIS — Z113 Encounter for screening for infections with a predominantly sexual mode of transmission: Secondary | ICD-10-CM | POA: Diagnosis not present

## 2020-10-16 DIAGNOSIS — O26891 Other specified pregnancy related conditions, first trimester: Secondary | ICD-10-CM | POA: Diagnosis not present

## 2020-10-16 DIAGNOSIS — Z3A10 10 weeks gestation of pregnancy: Secondary | ICD-10-CM | POA: Diagnosis not present

## 2020-10-16 DIAGNOSIS — Z3481 Encounter for supervision of other normal pregnancy, first trimester: Secondary | ICD-10-CM | POA: Diagnosis not present

## 2020-10-16 LAB — OB RESULTS CONSOLE ABO/RH: RH Type: POSITIVE

## 2020-10-16 LAB — OB RESULTS CONSOLE RPR: RPR: NONREACTIVE

## 2020-10-16 LAB — OB RESULTS CONSOLE RUBELLA ANTIBODY, IGM: Rubella: IMMUNE

## 2020-10-16 LAB — OB RESULTS CONSOLE HEPATITIS B SURFACE ANTIGEN: Hepatitis B Surface Ag: NEGATIVE

## 2020-10-16 LAB — OB RESULTS CONSOLE GC/CHLAMYDIA
Chlamydia: NEGATIVE
Gonorrhea: NEGATIVE

## 2020-10-16 LAB — OB RESULTS CONSOLE HIV ANTIBODY (ROUTINE TESTING): HIV: NONREACTIVE

## 2020-10-16 LAB — OB RESULTS CONSOLE ANTIBODY SCREEN: Antibody Screen: NEGATIVE

## 2020-10-20 DIAGNOSIS — Z419 Encounter for procedure for purposes other than remedying health state, unspecified: Secondary | ICD-10-CM | POA: Diagnosis not present

## 2020-10-21 LAB — HM PAP SMEAR: HM Pap smear: NEGATIVE

## 2020-10-31 ENCOUNTER — Inpatient Hospital Stay (HOSPITAL_COMMUNITY)
Admission: AD | Admit: 2020-10-31 | Discharge: 2020-10-31 | Disposition: A | Discharge status: Home / Self Care | Payer: Medicaid Other | Source: Ambulatory Visit | Attending: Obstetrics and Gynecology | Admitting: Obstetrics and Gynecology

## 2020-10-31 ENCOUNTER — Other Ambulatory Visit: Payer: Self-pay

## 2020-10-31 ENCOUNTER — Encounter (HOSPITAL_COMMUNITY): Payer: Self-pay | Admitting: Obstetrics and Gynecology

## 2020-10-31 DIAGNOSIS — O219 Vomiting of pregnancy, unspecified: Secondary | ICD-10-CM | POA: Diagnosis not present

## 2020-10-31 DIAGNOSIS — O99331 Smoking (tobacco) complicating pregnancy, first trimester: Secondary | ICD-10-CM | POA: Insufficient documentation

## 2020-10-31 DIAGNOSIS — Z3A12 12 weeks gestation of pregnancy: Secondary | ICD-10-CM | POA: Insufficient documentation

## 2020-10-31 DIAGNOSIS — F17210 Nicotine dependence, cigarettes, uncomplicated: Secondary | ICD-10-CM | POA: Insufficient documentation

## 2020-10-31 LAB — URINALYSIS, ROUTINE W REFLEX MICROSCOPIC
Bilirubin Urine: NEGATIVE
Glucose, UA: NEGATIVE mg/dL
Hgb urine dipstick: NEGATIVE
Ketones, ur: NEGATIVE mg/dL
Leukocytes,Ua: NEGATIVE
Nitrite: NEGATIVE
Protein, ur: NEGATIVE mg/dL
Specific Gravity, Urine: 1.008 (ref 1.005–1.030)
pH: 6 (ref 5.0–8.0)

## 2020-10-31 MED ORDER — ONDANSETRON 8 MG PO TBDP: 8.0000 mg | ORAL_TABLET | Freq: Three times a day (TID) | ORAL | 0 refills | Status: DC | PRN

## 2020-10-31 MED ORDER — ONDANSETRON 4 MG PO TBDP
8.0000 mg | ORAL_TABLET | Freq: Once | ORAL | Status: AC
  Administered 2020-10-31: 8 mg via ORAL
  Filled 2020-10-31: qty 2

## 2020-10-31 MED ORDER — LACTATED RINGERS IV BOLUS
1000.0000 mL | Freq: Once | INTRAVENOUS | Status: AC
  Administered 2020-10-31: 1000 mL via INTRAVENOUS

## 2020-10-31 MED ORDER — FAMOTIDINE 20 MG PO TABS: 20.0000 mg | ORAL_TABLET | Freq: Two times a day (BID) | ORAL | 1 refills | Status: DC

## 2020-10-31 MED ORDER — FAMOTIDINE IN NACL 20-0.9 MG/50ML-% IV SOLN
20.0000 mg | Freq: Once | INTRAVENOUS | Status: AC
  Administered 2020-10-31: 20 mg via INTRAVENOUS
  Filled 2020-10-31: qty 50

## 2020-10-31 NOTE — MAU Note (Addendum)
Past 3 or 4 days has done nothing but vomit.  Can't keep anything down.  Is feeling really tired.  Dr sent her in for fluids. Has not been on any meds for nausea.

## 2020-10-31 NOTE — MAU Provider Note (Signed)
History     CSN: [id number]  Arrival date and time: 10/31/20 1213   None     Chief Complaint  Patient presents with  . Emesis  . Fatigue   HPI  [name] is a 29 y.o. female G3P1011 @ 69w4dhere in MAU with nausea and vomiting. Over the last 3 days she reports worsening morning sickness. She reports 6 episodes of vomiting in the last 24 hours.  Reports not able to keep any fluids down or food. She feels more fatigued. She has no pain or bleeding.   Reports no weight loss. No one else in the home is sick.   + metal taste in pregnancy.   OB History    Gravida  3   Para  1   Term  1   Preterm      AB  1   Living  1     SAB  1   TAB      Ectopic      Multiple      Live Births  1           Past Medical History:  Diagnosis Date  . Allergy   . Asthma    seasonal   . Trichomonas     Past Surgical History:  Procedure Laterality Date  . NO PAST SURGERIES      Family History  Problem Relation Age of Onset  . Breast cancer Mother 32      negative BRCA testing in 2010  . Thyroid cancer Mother 375      papillary type  . Prostate cancer Father        dx. 42or younger  . Prostate cancer Paternal Grandfather        lim info  . Lung cancer Maternal Grandmother 67       was a smoker  . Other Maternal Aunt        potentially also had genetic testing  . Cervical cancer Paternal Grandmother   . Breast cancer Other        dx. 219s   Social History   Tobacco Use  . Smoking status: Current Every Day Smoker    Packs/day: 0.50    Years: 2.00    Pack years: 1.00    Types: Cigarettes  . Smokeless tobacco: Never Used  Vaping Use  . Vaping Use: Never used  Substance Use Topics  . Alcohol use: Not Currently    Alcohol/week: 0.0 standard drinks  . Drug use: No    Allergies: No Known Allergies  Medications Prior to Admission  Medication Sig Dispense Refill Last Dose  . acetaminophen (TYLENOL) 325 MG tablet Take 650 mg every 6 (six)  hours as needed by mouth for headache.     . albuterol (VENTOLIN HFA) 108 (90 Base) MCG/ACT inhaler Inhale 1-2 puffs into the lungs every 6 (six) hours as needed for wheezing or shortness of breath. 18 g 0   . hydrOXYzine (ATARAX/VISTARIL) 25 MG tablet Take 1 tablet (25 mg total) by mouth at bedtime as needed. 12 tablet 0   . loratadine (CLARITIN) 10 MG tablet Take 10 mg by mouth daily.      Results for orders placed or performed during the hospital encounter of 10/31/20 (from the past 48 hour(s))  Urinalysis, Routine w reflex microscopic Urine, Clean Catch     Status: Abnormal   Collection Time: 10/31/20 12:43 PM  Result Value Ref Range   Color, Urine STRAW (  A) YELLOW   APPearance CLEAR CLEAR   Specific Gravity, Urine 1.008 1.005 - 1.030   pH 6.0 5.0 - 8.0   Glucose, UA NEGATIVE NEGATIVE mg/dL   Hgb urine dipstick NEGATIVE NEGATIVE   Bilirubin Urine NEGATIVE NEGATIVE   Ketones, ur NEGATIVE NEGATIVE mg/dL   Protein, ur NEGATIVE NEGATIVE mg/dL   Nitrite NEGATIVE NEGATIVE   Leukocytes,Ua NEGATIVE NEGATIVE    Comment: Performed at [facility] [address], [place], [place] [address]   Review of Systems  Constitutional: Negative for fever.  Gastrointestinal: Positive for nausea and vomiting. Negative for abdominal pain.  Genitourinary: Negative for dysuria, vaginal bleeding and vaginal discharge.   Physical Exam   Blood pressure 123/72, pulse 67, temperature 98.2 F (36.8 C), temperature source Oral, resp. rate 18, height '5\' 3"'  (1.6 m), weight 102.8 kg, last menstrual period 08/04/2020, SpO2 100 %.  Physical Exam Constitutional:      General: She is not in acute distress.    Appearance: Normal appearance. She is not ill-appearing, toxic-appearing or diaphoretic.  HENT:     Head: Normocephalic.  Eyes:     Pupils: Pupils are equal, round, and reactive to light.  Musculoskeletal:        General: Normal range of motion.  Skin:    General: Skin is warm.  Neurological:      Mental Status: She is alert and oriented to person, place, and time.  Psychiatric:        Behavior: Behavior normal.    MAU Course  Procedures  None  MDM  UA without signs of dehydration.  LR bolus given X 1 Zofran 8 mg ODT & Pepcid IV No vomiting noted in MAU  Assessment and Plan   A:  1. Nausea and vomiting in pregnancy   2. [redacted] weeks gestation of pregnancy     P:  Discharge home in stable condition Rx: Zofran & Pepcid  Small, frequent meals Increase oral fluids F/u with OB  Return to MAU if symptoms worsen  Noni Saupe I, NP 10/31/2020 7:05 PM

## 2020-11-17 DIAGNOSIS — Z1379 Encounter for other screening for genetic and chromosomal anomalies: Secondary | ICD-10-CM | POA: Diagnosis not present

## 2020-11-19 DIAGNOSIS — Z419 Encounter for procedure for purposes other than remedying health state, unspecified: Secondary | ICD-10-CM | POA: Diagnosis not present

## 2020-12-17 ENCOUNTER — Inpatient Hospital Stay (HOSPITAL_COMMUNITY): Payer: Medicaid Other

## 2020-12-17 ENCOUNTER — Other Ambulatory Visit: Payer: Self-pay

## 2020-12-17 ENCOUNTER — Inpatient Hospital Stay (HOSPITAL_COMMUNITY)
Admission: AD | Admit: 2020-12-17 | Discharge: 2020-12-17 | Disposition: A | Discharge status: Home / Self Care | Payer: Medicaid Other | Attending: Obstetrics and Gynecology | Admitting: Obstetrics and Gynecology

## 2020-12-17 ENCOUNTER — Encounter (HOSPITAL_COMMUNITY): Payer: Self-pay | Admitting: Obstetrics and Gynecology

## 2020-12-17 DIAGNOSIS — M791 Myalgia, unspecified site: Secondary | ICD-10-CM | POA: Diagnosis not present

## 2020-12-17 DIAGNOSIS — U071 COVID-19: Secondary | ICD-10-CM | POA: Diagnosis not present

## 2020-12-17 DIAGNOSIS — Z87891 Personal history of nicotine dependence: Secondary | ICD-10-CM | POA: Insufficient documentation

## 2020-12-17 DIAGNOSIS — R0602 Shortness of breath: Secondary | ICD-10-CM | POA: Insufficient documentation

## 2020-12-17 DIAGNOSIS — Z3A19 19 weeks gestation of pregnancy: Secondary | ICD-10-CM | POA: Diagnosis not present

## 2020-12-17 DIAGNOSIS — O98512 Other viral diseases complicating pregnancy, second trimester: Secondary | ICD-10-CM | POA: Diagnosis not present

## 2020-12-17 DIAGNOSIS — R7611 Nonspecific reaction to tuberculin skin test without active tuberculosis: Secondary | ICD-10-CM | POA: Diagnosis not present

## 2020-12-17 LAB — CBC WITH DIFFERENTIAL/PLATELET
Abs Immature Granulocytes: 0.09 10*3/uL — ABNORMAL HIGH (ref 0.00–0.07)
Basophils Absolute: 0 10*3/uL (ref 0.0–0.1)
Basophils Relative: 0 %
Eosinophils Absolute: 0 10*3/uL (ref 0.0–0.5)
Eosinophils Relative: 0 %
HCT: 34.8 % — ABNORMAL LOW (ref 36.0–46.0)
Hemoglobin: 11.4 g/dL — ABNORMAL LOW (ref 12.0–15.0)
Immature Granulocytes: 1 %
Lymphocytes Relative: 28 %
Lymphs Abs: 1.9 10*3/uL (ref 0.7–4.0)
MCH: 29.4 pg (ref 26.0–34.0)
MCHC: 32.8 g/dL (ref 30.0–36.0)
MCV: 89.7 fL (ref 80.0–100.0)
Monocytes Absolute: 0.6 10*3/uL (ref 0.1–1.0)
Monocytes Relative: 8 %
Neutro Abs: 4.2 10*3/uL (ref 1.7–7.7)
Neutrophils Relative %: 63 %
Platelets: 193 10*3/uL (ref 150–400)
RBC: 3.88 MIL/uL (ref 3.87–5.11)
RDW: 12.8 % (ref 11.5–15.5)
WBC: 6.8 10*3/uL (ref 4.0–10.5)
nRBC: 0 % (ref 0.0–0.2)

## 2020-12-17 LAB — COMPREHENSIVE METABOLIC PANEL
ALT: 21 U/L (ref 0–44)
AST: 25 U/L (ref 15–41)
Albumin: 2.9 g/dL — ABNORMAL LOW (ref 3.5–5.0)
Alkaline Phosphatase: 42 U/L (ref 38–126)
Anion gap: 9 (ref 5–15)
BUN: 6 mg/dL (ref 6–20)
CO2: 24 mmol/L (ref 22–32)
Calcium: 8.3 mg/dL — ABNORMAL LOW (ref 8.9–10.3)
Chloride: 104 mmol/L (ref 98–111)
Creatinine, Ser: 0.55 mg/dL (ref 0.44–1.00)
GFR, Estimated: >60 mL/min (ref >60)
Glucose, Bld: 77 mg/dL (ref 70–99)
Potassium: 4.2 mmol/L (ref 3.5–5.1)
Sodium: 137 mmol/L (ref 135–145)
Total Bilirubin: 0.6 mg/dL (ref 0.3–1.2)
Total Protein: 5.8 g/dL — ABNORMAL LOW (ref 6.5–8.1)

## 2020-12-17 LAB — URINALYSIS, ROUTINE W REFLEX MICROSCOPIC
Bilirubin Urine: 0 — AB
Glucose, UA: 0 mg/dL — AB
Hgb urine dipstick: 0 — AB
Ketones, ur: 0 mg/dL — AB
Leukocytes,Ua: 1 — AB
Nitrite: 0 — AB
Protein, ur: 0 mg/dL — AB
Specific Gravity, Urine: 1.025 (ref 1.005–1.030)
pH: 5 (ref 5.0–8.0)

## 2020-12-17 LAB — RESP PANEL BY RT-PCR (FLU A&B, COVID) ARPGX2
Influenza A by PCR: NEGATIVE
Influenza B by PCR: NEGATIVE
SARS Coronavirus 2 by RT PCR: POSITIVE — AB

## 2020-12-17 LAB — TROPONIN I (HIGH SENSITIVITY): Troponin I (High Sensitivity): <2 ng/L (ref <18)

## 2020-12-17 LAB — SARS CORONAVIRUS 2 (TAT 6-24 HRS): SARS Coronavirus 2: POSITIVE — AB

## 2020-12-17 LAB — BRAIN NATRIURETIC PEPTIDE: B Natriuretic Peptide: 15.7 pg/mL (ref 0.0–100.0)

## 2020-12-17 NOTE — MAU Note (Signed)
Pt reports Christmas night she started having difficulty breathing. Pt reports at the time you had a sore throat. Pt reports having a fever and vomiting. Pt reports body aches. These symptoms have been going on for three days, but today the only thing that is bother the patient is her chest tightness.  Pt reports when she takes a breath her chest feels tight. Pt also reports cough today.   Denies vaginal bleeding or LOF.

## 2020-12-17 NOTE — Discharge Instructions (Signed)
Please stay isolated from EVERYONE who does not live with you for a minimum of 10 days.

## 2020-12-17 NOTE — MAU Provider Note (Signed)
History     CSN: [id number]  Arrival date and time: 12/17/20 1246   Event Date/Time   First Provider Initiated Contact with Patient 12/17/20 1458      Chief Complaint  Patient presents with  . Shortness of Breath   [name] is a 29 y.o. year old G91P1011 female at 9w2dweeks gestation who presents to MAU reporting that her little cousin came to her house on Christmas day with a fever. She now has complaints of difficulty breathing since then, sore throat, fever, vomiting and body aches. She states the symptoms are for 3 days. She is here today because she started having chest tightness today. She reports her mother and son, who tested (+) COVID today, are both also sick. [facility]   OB History    Gravida  3   Para  1   Term  1   Preterm      AB  1   Living  1     SAB  1   IAB      Ectopic      Multiple      Live Births  1           Past Medical History:  Diagnosis Date  . Allergy   . Asthma    seasonal   . Trichomonas     Past Surgical History:  Procedure Laterality Date  . NO PAST SURGERIES      Family History  Problem Relation Age of Onset  . Breast cancer Mother 386      negative BRCA testing in 2010  . Thyroid cancer Mother 339      papillary type  . Prostate cancer Father        dx. 429or younger  . Prostate cancer Paternal Grandfather        lim info  . Lung cancer Maternal Grandmother 67       was a smoker  . Other Maternal Aunt        potentially also had genetic testing  . Cervical cancer Paternal Grandmother   . Breast cancer Other        dx. 264s   Social History   Tobacco Use  . Smoking status: Former Smoker    Packs/day: 0.50    Years: 2.00    Pack years: 1.00    Types: Cigarettes  . Smokeless tobacco: Never Used  Vaping Use  . Vaping Use: Never used  Substance Use Topics  . Alcohol use: Not Currently    Alcohol/week: 0.0 standard drinks  . Drug use: No    Allergies: No  Known Allergies  Medications Prior to Admission  Medication Sig Dispense Refill Last Dose  . acetaminophen (TYLENOL) 325 MG tablet Take 650 mg every 6 (six) hours as needed by mouth for headache.   12/17/2020 at Unknown time  . albuterol (VENTOLIN HFA) 108 (90 Base) MCG/ACT inhaler Inhale 1-2 puffs into the lungs every 6 (six) hours as needed for wheezing or shortness of breath. 18 g 0 12/17/2020 at Unknown time  . famotidine (PEPCID) 20 MG tablet Take 1 tablet (20 mg total) by mouth 2 (two) times daily. 60 tablet 1   . hydrOXYzine (ATARAX/VISTARIL) 25 MG tablet Take 1 tablet (25 mg total) by mouth at bedtime as needed. 12 tablet 0   . loratadine (CLARITIN) 10 MG tablet Take 10 mg by mouth daily.     . ondansetron (ZOFRAN ODT)  8 MG disintegrating tablet Take 1 tablet (8 mg total) by mouth every 8 (eight) hours as needed for nausea or vomiting. 20 tablet 0     Review of Systems  Constitutional: Positive for fever.  Eyes: Negative.   Respiratory: Positive for cough and chest tightness.   Gastrointestinal: Positive for nausea and vomiting.  Endocrine: Negative.   Genitourinary: Negative.   Musculoskeletal: Positive for myalgias.  Skin: Negative.   Allergic/Immunologic: Negative.   Neurological: Positive for weakness.  Hematological: Negative.   Psychiatric/Behavioral: Negative.    Physical Exam   Temperature 98.2 F (36.8 C), temperature source Oral, resp. rate 19, weight 109.6 kg, last menstrual period 08/04/2020, SpO2 100 %.  Physical Exam Vitals and nursing note reviewed.  Constitutional:      Appearance: Normal appearance. She is obese.  HENT:     Head: Normocephalic and atraumatic.  Cardiovascular:     Rate and Rhythm: Normal rate.  Pulmonary:     Effort: Pulmonary effort is normal.  Genitourinary:    Comments: deferred Musculoskeletal:        General: Normal range of motion.     Cervical back: Normal range of motion.  Skin:    General: Skin is warm and dry.   Neurological:     Mental Status: She is alert and oriented to person, place, and time.  Psychiatric:        Mood and Affect: Mood normal.        Behavior: Behavior normal.        Thought Content: Thought content normal.        Judgment: Judgment normal.    FHTs by doppler: 143 bpm  MAU Course  Procedures  MDM CCUA UCx -- Results pending CBC w/Diff BNP CMP Troponin EKG Chest X-Ray 1 view  Results for orders placed or performed during the hospital encounter of 12/17/20 (from the past 24 hour(s))  Resp Panel by RT-PCR (Flu A&B, Covid) Nasopharyngeal Swab     Status: Abnormal   Collection Time: 12/17/20  1:16 PM   Specimen: Nasopharyngeal Swab; Nasopharyngeal(NP) swabs in vial transport medium  Result Value Ref Range   SARS Coronavirus 2 by RT PCR POSITIVE (A) NEGATIVE   Influenza A by PCR NEGATIVE NEGATIVE   Influenza B by PCR NEGATIVE NEGATIVE  Urinalysis, Routine w reflex microscopic Urine, Clean Catch     Status: Abnormal   Collection Time: 12/17/20  1:31 PM  Result Value Ref Range   Color, Urine YELLOW YELLOW   APPearance HAZY (A) CLEAR   Specific Gravity, Urine 1.025 1.005 - 1.030   pH 5.0 5.0 - 8.0   Glucose, UA 0 (A) NEGATIVE mg/dL   Hgb urine dipstick 0 (A) NEGATIVE   Bilirubin Urine 0 (A) NEGATIVE   Ketones, ur 0 (A) NEGATIVE mg/dL   Protein, ur 0 (A) NEGATIVE mg/dL   Nitrite 0 (A) NEGATIVE   Leukocytes,Ua 1 (A) NEGATIVE   RBC / HPF 0-5 0 - 5 RBC/hpf   WBC, UA 0-5 0 - 5 WBC/hpf   Bacteria, UA RARE (A) NONE SEEN   Squamous Epithelial / LPF 6-10 0 - 5   Mucus PRESENT    Ca Oxalate Crys, UA PRESENT    Crystals PRESENT (A) NEGATIVE  CBC with Differential/Platelet     Status: Abnormal   Collection Time: 12/17/20  2:06 PM  Result Value Ref Range   WBC 6.8 4.0 - 10.5 K/uL   RBC 3.88 3.87 - 5.11 MIL/uL   Hemoglobin 11.4 (L) 12.0 -  15.0 g/dL   HCT 34.8 (L) 36.0 - 46.0 %   MCV 89.7 80.0 - 100.0 fL   MCH 29.4 26.0 - 34.0 pg   MCHC 32.8 30.0 - 36.0 g/dL   RDW  12.8 11.5 - 15.5 %   Platelets 193 150 - 400 K/uL   nRBC 0.0 0.0 - 0.2 %   Neutrophils Relative % 63 %   Neutro Abs 4.2 1.7 - 7.7 K/uL   Lymphocytes Relative 28 %   Lymphs Abs 1.9 0.7 - 4.0 K/uL   Monocytes Relative 8 %   Monocytes Absolute 0.6 0.1 - 1.0 K/uL   Eosinophils Relative 0 %   Eosinophils Absolute 0.0 0.0 - 0.5 K/uL   Basophils Relative 0 %   Basophils Absolute 0.0 0.0 - 0.1 K/uL   Immature Granulocytes 1 %   Abs Immature Granulocytes 0.09 (H) 0.00 - 0.07 K/uL  Brain natriuretic peptide     Status: None   Collection Time: 12/17/20  2:06 PM  Result Value Ref Range   B Natriuretic Peptide 15.7 0.0 - 100.0 pg/mL  Comprehensive metabolic panel     Status: Abnormal   Collection Time: 12/17/20  2:06 PM  Result Value Ref Range   Sodium 137 135 - 145 mmol/L   Potassium 4.2 3.5 - 5.1 mmol/L   Chloride 104 98 - 111 mmol/L   CO2 24 22 - 32 mmol/L   Glucose, Bld 77 70 - 99 mg/dL   BUN 6 6 - 20 mg/dL   Creatinine, Ser 0.55 0.44 - 1.00 mg/dL   Calcium 8.3 (L) 8.9 - 10.3 mg/dL   Total Protein 5.8 (L) 6.5 - 8.1 g/dL   Albumin 2.9 (L) 3.5 - 5.0 g/dL   AST 25 15 - 41 U/L   ALT 21 0 - 44 U/L   Alkaline Phosphatase 42 38 - 126 U/L   Total Bilirubin 0.6 0.3 - 1.2 mg/dL   GFR, Estimated >60 >60 mL/min   Anion gap 9 5 - 15  Troponin I (High Sensitivity)     Status: None   Collection Time: 12/17/20  2:06 PM  Result Value Ref Range   Troponin I (High Sensitivity) <2 <18 ng/L     DG Chest Portable 1 View  Result Date: 12/17/2020 CLINICAL DATA:  COVID-19 positive. EXAM: PORTABLE CHEST 1 VIEW COMPARISON:  June 11, 2019. FINDINGS: The heart size and mediastinal contours are within normal limits. Both lungs are clear. The visualized skeletal structures are unremarkable. IMPRESSION: No active disease. Electronically Signed   By: Marijo Conception M.D.   On: 12/17/2020 14:42    Assessment and Plan  COVID-19 affecting pregnancy in second trimester - Information provided on COVID-19 and  preventing the spread and COVID-19 in pregnancy - Call [facility] to notify them of your (+) COVID and ask when they want you to be seen in the office. - Patient verbalized an understanding of the plan of care and agrees.    Laury Deep, CNM 12/17/2020, 2:58 PM

## 2020-12-18 ENCOUNTER — Telehealth: Payer: Self-pay | Admitting: Nurse Practitioner

## 2020-12-18 ENCOUNTER — Other Ambulatory Visit: Payer: Self-pay | Admitting: Nurse Practitioner

## 2020-12-18 DIAGNOSIS — U071 COVID-19: Secondary | ICD-10-CM

## 2020-12-18 LAB — CULTURE, OB URINE

## 2020-12-18 NOTE — Telephone Encounter (Signed)
Called to Discuss with patient about Covid symptoms and the use of the monoclonal antibody infusion for those with mild to moderate Covid symptoms and at a high risk of hospitalization.     Pt appears to qualify for this infusion due to co-morbid conditions and/or a member of an at-risk group in accordance with the FDA Emergency Use Authorization- SVI, obesity, pregnancy, asthma, unvaccinated.  Patient wishes to discuss with her OB/GYN and call back.   Symptom onset: 12/25 Vaccinated: no Qualified for Infusion: yes

## 2020-12-18 NOTE — Progress Notes (Signed)
I connected by phone with patient to discuss the potential use of a new treatment for mild to moderate COVID-19 viral infection in non-hospitalized patients.   This patient is a that meets the FDA criteria for Emergency Use Authorization of COVID monoclonal antibody  1. Has a (+) direct SARS-CoV-2 viral test result 2. Has mild or moderate COVID-19  3. Is NOT hospitalized due to COVID-19 4. Is within 7 days of symptom onset 5. Has at least one of the high risk factor(s) for progression to severe COVID-19 and/or hospitalization as defined in EUA. Specific high risk criteria :pregnancy, obesity, htn, unvaccinated, asthma on active treatment    I have spoken and communicated the following to the patient or parent/caregiver regarding COVID monoclonal antibody treatment:   1. FDA has authorized the emergency use for the treatment of high risk post-exposure prophylaxis for COVID19.    2. The significant known and potential risks and benefits of COVID monoclonal antibody, and the extent to which such potential risks and benefits are unknown.    3. Patients treated with COVID monoclonal antibody should continue to      self-isolate and use infection control measures (e.g., wear mask, isolate, social distance, avoid sharing personal items, clean and disinfect "high touch" surfaces, and frequent handwashing) according to CDC guidelines.    4.  The patient or parent/caregiver has the option to accept or refuse COVID monoclonal antibody treatment.  5. Recommendations for COVID vaccine in addition to MAB infusion as antibodies infused are not permanent and will not provide long term protection from COVID infections   After reviewing this information with the patient, The patient agreed to proceed with receiving monoclonal antibody infusion and will be provided a copy of the Fact sheet prior to receiving the infusion. Ocie Bob, NP

## 2020-12-19 ENCOUNTER — Ambulatory Visit (HOSPITAL_COMMUNITY)
Admission: RE | Admit: 2020-12-19 | Discharge: 2020-12-19 | Disposition: A | Discharge status: Home / Self Care | Payer: Medicaid Other | Source: Ambulatory Visit | Attending: Pulmonary Disease | Admitting: Pulmonary Disease

## 2020-12-19 DIAGNOSIS — U071 COVID-19: Secondary | ICD-10-CM | POA: Diagnosis not present

## 2020-12-19 MED ORDER — DIPHENHYDRAMINE HCL 50 MG/ML IJ SOLN: 50.0000 mg | Freq: Once | INTRAMUSCULAR | Status: DC | PRN

## 2020-12-19 MED ORDER — SODIUM CHLORIDE 0.9 % IV SOLN: Freq: Once | INTRAVENOUS | Status: AC

## 2020-12-19 MED ORDER — ALBUTEROL SULFATE HFA 108 (90 BASE) MCG/ACT IN AERS: 2.0000 | INHALATION_SPRAY | Freq: Once | RESPIRATORY_TRACT | Status: DC | PRN

## 2020-12-19 MED ORDER — FAMOTIDINE IN NACL 20-0.9 MG/50ML-% IV SOLN: 20.0000 mg | Freq: Once | INTRAVENOUS | Status: DC | PRN

## 2020-12-19 MED ORDER — METHYLPREDNISOLONE SODIUM SUCC 125 MG IJ SOLR: 125.0000 mg | Freq: Once | INTRAMUSCULAR | Status: DC | PRN

## 2020-12-19 MED ORDER — EPINEPHRINE 0.3 MG/0.3ML IJ SOAJ: 0.3000 mg | Freq: Once | INTRAMUSCULAR | Status: DC | PRN

## 2020-12-19 MED ORDER — SODIUM CHLORIDE 0.9 % IV SOLN: INTRAVENOUS | Status: DC | PRN

## 2020-12-19 NOTE — Progress Notes (Signed)
Patient reviewed Fact Sheet for Patients, Parents, and Caregivers for Emergency Use Authorization (EUA) of casirivimab and imdevimab for the Treatment of Coronavirus. Patient also reviewed and is agreeable to the estimated cost of treatment. Patient is agreeable to proceed.   

## 2020-12-19 NOTE — Discharge Instructions (Signed)
10 Things You Can Do to Manage Your COVID-19 Symptoms at Home If you have possible or confirmed COVID-19: 1. Stay home from work and school. And stay away from other public places. If you must go out, avoid using any kind of public transportation, ridesharing, or taxis. 2. Monitor your symptoms carefully. If your symptoms get worse, call your healthcare provider immediately. 3. Get rest and stay hydrated. 4. If you have a medical appointment, call the healthcare provider ahead of time and tell them that you have or may have COVID-19. 5. For medical emergencies, call 911 and notify the dispatch personnel that you have or may have COVID-19. 6. Cover your cough and sneezes with a tissue or use the inside of your elbow. 7. Wash your hands often with soap and water for at least 20 seconds or clean your hands with an alcohol-based hand sanitizer that contains at least 60% alcohol. 8. As much as possible, stay in a specific room and away from other people in your home. Also, you should use a separate bathroom, if available. If you need to be around other people in or outside of the home, wear a mask. 9. Avoid sharing personal items with other people in your household, like dishes, towels, and bedding. 10. Clean all surfaces that are touched often, like counters, tabletops, and doorknobs. Use household cleaning sprays or wipes according to the label instructions. cdc.gov/coronavirus 06/20/2019 This information is not intended to replace advice given to you by your health care provider. Make sure you discuss any questions you have with your health care provider. Document Revised: 11/22/2019 Document Reviewed: 11/22/2019 Elsevier Patient Education  2020 Elsevier Inc. What types of side effects do monoclonal antibody drugs cause?  Common side effects  In general, the more common side effects caused by monoclonal antibody drugs include: . Allergic reactions, such as hives or itching . Flu-like signs and  symptoms, including chills, fatigue, fever, and muscle aches and pains . Nausea, vomiting . Diarrhea . Skin rashes . Low blood pressure   The CDC is recommending patients who receive monoclonal antibody treatments wait at least 90 days before being vaccinated.  Currently, there are no data on the safety and efficacy of mRNA COVID-19 vaccines in persons who received monoclonal antibodies or convalescent plasma as part of COVID-19 treatment. Based on the estimated half-life of such therapies as well as evidence suggesting that reinfection is uncommon in the 90 days after initial infection, vaccination should be deferred for at least 90 days, as a precautionary measure until additional information becomes available, to avoid interference of the antibody treatment with vaccine-induced immune responses. If you have any questions or concerns after the infusion please call the Advanced Practice Provider on call at [phone]. This number is ONLY intended for your use regarding questions or concerns about the infusion post-treatment side-effects.  Please do not provide this number to others for use. For return to work notes please contact your primary care provider.   If someone you know is interested in receiving treatment please have them call the COVID hotline at [phone].   

## 2020-12-19 NOTE — Progress Notes (Signed)
  Diagnosis: COVID-19  Physician:Dr Wright   Procedure: Covid Infusion Clinic Med: casirivimab\imdevimab infusion - Provided patient with casirivimab\imdevimab fact sheet for patients, parents and caregivers prior to infusion.  Complications: No immediate complications noted.  Discharge: Discharged home   Becky Scott 12/19/2020  

## 2020-12-20 DIAGNOSIS — Z419 Encounter for procedure for purposes other than remedying health state, unspecified: Secondary | ICD-10-CM | POA: Diagnosis not present

## 2020-12-20 NOTE — L&D Delivery Note (Signed)
Delivery Note Pt complained of continued pain on right side despite epidural getting redosed. On exam, pt noted to be complete and at 0 station. Pt proceeded to push for 15-60mins and at 5:36 AM a viable female was delivered via Vaginal, Spontaneous (Presentation: Left Occiput Anterior).  APGAR: 9, 9; weight pending .   Anterior and posterior shoulders delivered with no difficulty; body easily followed. Placenta status: Spontaneous, Intact. Schultz  Cord: 3vc  with the following complications:none  .  Cord pH: n/a Cord blood obtained.   Anesthesia: Epidural Episiotomy:  none Lacerations:  Left upper vaginal abrasion  Suture Repair: n/a Est. Blood Loss (mL):  76ml  Mom to postpartum.  Baby to Couplet care / Skin to Skin  Pt would like baby to be circumcised.  [name] 05/07/2021, 5:47 AM

## 2020-12-22 DIAGNOSIS — O09292 Supervision of pregnancy with other poor reproductive or obstetric history, second trimester: Secondary | ICD-10-CM | POA: Diagnosis not present

## 2020-12-22 DIAGNOSIS — Z363 Encounter for antenatal screening for malformations: Secondary | ICD-10-CM | POA: Diagnosis not present

## 2020-12-22 DIAGNOSIS — Z3A20 20 weeks gestation of pregnancy: Secondary | ICD-10-CM | POA: Diagnosis not present

## 2020-12-22 DIAGNOSIS — Z369 Encounter for antenatal screening, unspecified: Secondary | ICD-10-CM | POA: Diagnosis not present

## 2021-01-20 DIAGNOSIS — Z419 Encounter for procedure for purposes other than remedying health state, unspecified: Secondary | ICD-10-CM | POA: Diagnosis not present

## 2021-02-11 DIAGNOSIS — O99512 Diseases of the respiratory system complicating pregnancy, second trimester: Secondary | ICD-10-CM | POA: Diagnosis not present

## 2021-02-11 DIAGNOSIS — Z3A27 27 weeks gestation of pregnancy: Secondary | ICD-10-CM | POA: Diagnosis not present

## 2021-02-11 DIAGNOSIS — O99212 Obesity complicating pregnancy, second trimester: Secondary | ICD-10-CM | POA: Diagnosis not present

## 2021-02-11 DIAGNOSIS — Z3689 Encounter for other specified antenatal screening: Secondary | ICD-10-CM | POA: Diagnosis not present

## 2021-02-11 DIAGNOSIS — Z23 Encounter for immunization: Secondary | ICD-10-CM | POA: Diagnosis not present

## 2021-02-13 DIAGNOSIS — O99810 Abnormal glucose complicating pregnancy: Secondary | ICD-10-CM | POA: Diagnosis not present

## 2021-02-17 DIAGNOSIS — Z419 Encounter for procedure for purposes other than remedying health state, unspecified: Secondary | ICD-10-CM | POA: Diagnosis not present

## 2021-03-20 DIAGNOSIS — Z419 Encounter for procedure for purposes other than remedying health state, unspecified: Secondary | ICD-10-CM | POA: Diagnosis not present

## 2021-03-24 DIAGNOSIS — Z3A33 33 weeks gestation of pregnancy: Secondary | ICD-10-CM | POA: Diagnosis not present

## 2021-03-24 DIAGNOSIS — O99213 Obesity complicating pregnancy, third trimester: Secondary | ICD-10-CM | POA: Diagnosis not present

## 2021-03-24 DIAGNOSIS — O3663X0 Maternal care for excessive fetal growth, third trimester, not applicable or unspecified: Secondary | ICD-10-CM | POA: Diagnosis not present

## 2021-03-24 DIAGNOSIS — Z3483 Encounter for supervision of other normal pregnancy, third trimester: Secondary | ICD-10-CM | POA: Diagnosis not present

## 2021-04-14 DIAGNOSIS — Z3483 Encounter for supervision of other normal pregnancy, third trimester: Secondary | ICD-10-CM | POA: Diagnosis not present

## 2021-04-14 DIAGNOSIS — Z3685 Encounter for antenatal screening for Streptococcus B: Secondary | ICD-10-CM | POA: Diagnosis not present

## 2021-04-14 DIAGNOSIS — Z3A36 36 weeks gestation of pregnancy: Secondary | ICD-10-CM | POA: Diagnosis not present

## 2021-04-19 DIAGNOSIS — Z419 Encounter for procedure for purposes other than remedying health state, unspecified: Secondary | ICD-10-CM | POA: Diagnosis not present

## 2021-04-21 DIAGNOSIS — Z3A37 37 weeks gestation of pregnancy: Secondary | ICD-10-CM | POA: Diagnosis not present

## 2021-04-21 DIAGNOSIS — Z3483 Encounter for supervision of other normal pregnancy, third trimester: Secondary | ICD-10-CM | POA: Diagnosis not present

## 2021-04-21 DIAGNOSIS — Z3685 Encounter for antenatal screening for Streptococcus B: Secondary | ICD-10-CM | POA: Diagnosis not present

## 2021-04-23 ENCOUNTER — Telehealth (HOSPITAL_COMMUNITY): Payer: Self-pay | Admitting: *Deleted

## 2021-04-23 NOTE — Telephone Encounter (Signed)
Preadmission screen  

## 2021-04-24 ENCOUNTER — Encounter (HOSPITAL_COMMUNITY): Payer: Self-pay | Admitting: *Deleted

## 2021-05-04 ENCOUNTER — Other Ambulatory Visit (HOSPITAL_COMMUNITY)
Admission: RE | Admit: 2021-05-04 | Discharge: 2021-05-04 | Disposition: A | Discharge status: Home / Self Care | Payer: Medicaid Other | Source: Ambulatory Visit | Attending: Obstetrics and Gynecology | Admitting: Obstetrics and Gynecology

## 2021-05-04 DIAGNOSIS — Z01812 Encounter for preprocedural laboratory examination: Secondary | ICD-10-CM | POA: Diagnosis not present

## 2021-05-04 DIAGNOSIS — Z20822 Contact with and (suspected) exposure to covid-19: Secondary | ICD-10-CM | POA: Insufficient documentation

## 2021-05-05 LAB — SARS CORONAVIRUS 2 (TAT 6-24 HRS): SARS Coronavirus 2: NEGATIVE

## 2021-05-05 NOTE — H&P (Signed)
Becky Scott is a 30 y.o.G53P1011  female presenting for elective IOL at 69 1/[redacted]wks gestation. Pt is dated per LMP which was confirmed with a 10week [place]. She had had a relatively benign prenatal course.  She has a history of asthma - seasonal , is RNI,  She got covid in December 2021 and received monoclonal antibodies. She is GBS negative. MaterniT test was neg  OB History    Gravida  3   Para  1   Term  1   Preterm      AB  1   Living  1     SAB  1   IAB      Ectopic      Multiple      Live Births  1          Past Medical History:  Diagnosis Date  . Allergy   . Asthma    seasonal   . Trichomonas    Past Surgical History:  Procedure Laterality Date  . NO PAST SURGERIES     Family History: family history includes Breast cancer in an other family member; Breast cancer (age of onset: 26) in her mother; Cervical cancer in her paternal grandmother; Lung cancer (age of onset: 59) in her maternal grandmother; Other in her maternal aunt; Prostate cancer in her father and paternal grandfather; Thyroid cancer (age of onset: 53) in her mother. Social History:  reports that she has quit smoking. Her smoking use included cigarettes. She has a 1.00 pack-year smoking history. She has never used smokeless tobacco. She reports previous alcohol use. She reports that she does not use drugs.     Maternal Diabetes: No Genetic Screening: Normal Maternal Ultrasounds/Referrals: Normal Fetal Ultrasounds or other Referrals:  None Maternal Substance Abuse:  No Significant Maternal Medications:  None Significant Maternal Lab Results:  Group B Strep negative Other Comments:  None  Review of Systems  Constitutional: Positive for activity change and fatigue.  Eyes: Negative for photophobia and visual disturbance.  Respiratory: Negative for chest tightness and shortness of breath.   Cardiovascular: Negative for chest pain, palpitations and leg swelling.  Gastrointestinal: Positive for  abdominal pain.  Genitourinary: Positive for pelvic pain.  Musculoskeletal: Negative for back pain.  Neurological: Negative for light-headedness, numbness and headaches.  Psychiatric/Behavioral: The patient is not nervous/anxious.   All other systems reviewed and are negative.  Maternal Medical History:  Reason for admission: Elective term IOL  Contractions: Onset was 1 week or more ago.   Frequency: irregular.   Perceived severity is mild.    Fetal activity: Perceived fetal activity is normal.    Prenatal complications: no prenatal complications Prenatal Complications - Diabetes: none.      Last menstrual period 08/04/2020. Maternal Exam:  Uterine Assessment: Contraction strength is mild.  Contraction frequency is rare and irregular.   Abdomen: Patient reports generalized tenderness.  Estimated fetal weight is AGA.   Fetal presentation: vertex  Introitus: Normal vulva. Vulva is negative for condylomata and lesion.  Normal vagina.  Vagina is negative for condylomata and discharge.  Pelvis: adequate for delivery.   Cervix: Cervix evaluated by digital exam.     Physical Exam Vitals and nursing note reviewed. Exam conducted with a chaperone present.  Constitutional:      Appearance: Normal appearance. She is obese.  Cardiovascular:     Rate and Rhythm: Normal rate.     Pulses: Normal pulses.  Pulmonary:     Effort: Pulmonary effort is normal.  Abdominal:  Tenderness: There is generalized abdominal tenderness.  Genitourinary:    General: Normal vulva.  Vulva is no lesion.     Vagina: No vaginal discharge.  Musculoskeletal:        General: Normal range of motion.     Cervical back: Normal range of motion.  Skin:    General: Skin is warm.     Capillary Refill: Capillary refill takes 2 to 3 seconds.  Neurological:     General: No focal deficit present.     Mental Status: She is alert and oriented to person, place, and time. Mental status is at baseline.   Psychiatric:        Mood and Affect: Mood normal.        Behavior: Behavior normal.        Thought Content: Thought content normal.        Judgment: Judgment normal.     Prenatal labs: ABO, Rh: O/Positive/-- (10/28 0000) Antibody: Negative (10/28 0000) Rubella: Immune (10/28 0000) RPR: Nonreactive (10/28 0000)  HBsAg: Negative (10/28 0000)  HIV: Non-reactive (10/28 0000)  GBS:     Assessment/Plan: 43XV [id number] female here for elective IOL at term -Admit -GBS neg -Cytotec for ripening  -Pain control prn    Janean Sark Kirby Cortese 05/05/2021, 6:30 PM

## 2021-05-06 ENCOUNTER — Inpatient Hospital Stay (HOSPITAL_COMMUNITY): Payer: Medicaid Other | Admitting: Anesthesiology

## 2021-05-06 ENCOUNTER — Other Ambulatory Visit: Payer: Self-pay

## 2021-05-06 ENCOUNTER — Inpatient Hospital Stay (HOSPITAL_COMMUNITY)
Admission: AD | Admit: 2021-05-06 | Discharge: 2021-05-08 | DRG: 807 | Disposition: A | Discharge status: Home / Self Care | Payer: Medicaid Other | Attending: Obstetrics and Gynecology | Admitting: Obstetrics and Gynecology

## 2021-05-06 ENCOUNTER — Inpatient Hospital Stay (HOSPITAL_COMMUNITY): Payer: Medicaid Other

## 2021-05-06 ENCOUNTER — Encounter (HOSPITAL_COMMUNITY): Payer: Self-pay | Admitting: Obstetrics and Gynecology

## 2021-05-06 DIAGNOSIS — Z8616 Personal history of COVID-19: Secondary | ICD-10-CM | POA: Diagnosis not present

## 2021-05-06 DIAGNOSIS — Z87891 Personal history of nicotine dependence: Secondary | ICD-10-CM

## 2021-05-06 DIAGNOSIS — O9952 Diseases of the respiratory system complicating childbirth: Secondary | ICD-10-CM | POA: Diagnosis not present

## 2021-05-06 DIAGNOSIS — Z3A39 39 weeks gestation of pregnancy: Secondary | ICD-10-CM | POA: Diagnosis not present

## 2021-05-06 DIAGNOSIS — O99214 Obesity complicating childbirth: Principal | ICD-10-CM | POA: Diagnosis present

## 2021-05-06 DIAGNOSIS — O26893 Other specified pregnancy related conditions, third trimester: Secondary | ICD-10-CM | POA: Diagnosis not present

## 2021-05-06 DIAGNOSIS — J45909 Unspecified asthma, uncomplicated: Secondary | ICD-10-CM | POA: Diagnosis not present

## 2021-05-06 DIAGNOSIS — Z3490 Encounter for supervision of normal pregnancy, unspecified, unspecified trimester: Secondary | ICD-10-CM

## 2021-05-06 LAB — CBC
HCT: 34.8 % — ABNORMAL LOW (ref 36.0–46.0)
Hemoglobin: 11.7 g/dL — ABNORMAL LOW (ref 12.0–15.0)
MCH: 28.7 pg (ref 26.0–34.0)
MCHC: 33.6 g/dL (ref 30.0–36.0)
MCV: 85.3 fL (ref 80.0–100.0)
Platelets: 256 10*3/uL (ref 150–400)
RBC: 4.08 MIL/uL (ref 3.87–5.11)
RDW: 14.2 % (ref 11.5–15.5)
WBC: 14.2 10*3/uL — ABNORMAL HIGH (ref 4.0–10.5)
nRBC: 0 % (ref 0.0–0.2)

## 2021-05-06 LAB — TYPE AND SCREEN
ABO/RH(D): O POS
Antibody Screen: NEGATIVE

## 2021-05-06 MED ORDER — EPHEDRINE 5 MG/ML INJ: 10.0000 mg | INTRAVENOUS | Status: DC | PRN

## 2021-05-06 MED ORDER — LIDOCAINE HCL (PF) 1 % IJ SOLN: 30.0000 mL | INTRAMUSCULAR | Status: DC | PRN

## 2021-05-06 MED ORDER — OXYCODONE-ACETAMINOPHEN 5-325 MG PO TABS: 2.0000 | ORAL_TABLET | ORAL | Status: DC | PRN

## 2021-05-06 MED ORDER — PHENYLEPHRINE 40 MCG/ML (10ML) SYRINGE FOR IV PUSH (FOR BLOOD PRESSURE SUPPORT)
80.0000 ug | PREFILLED_SYRINGE | INTRAVENOUS | Status: DC | PRN
  Filled 2021-05-06: qty 10

## 2021-05-06 MED ORDER — FENTANYL-BUPIVACAINE-NACL 0.5-0.125-0.9 MG/250ML-% EP SOLN
12.0000 mL/h | EPIDURAL | Status: DC | PRN
  Administered 2021-05-06: 12 mL/h via EPIDURAL
  Filled 2021-05-06: qty 250

## 2021-05-06 MED ORDER — MISOPROSTOL 25 MCG QUARTER TABLET
25.0000 ug | ORAL_TABLET | ORAL | Status: DC | PRN
  Administered 2021-05-06: 25 ug via VAGINAL
  Filled 2021-05-06: qty 1

## 2021-05-06 MED ORDER — DIPHENHYDRAMINE HCL 50 MG/ML IJ SOLN: 12.5000 mg | INTRAMUSCULAR | Status: DC | PRN

## 2021-05-06 MED ORDER — PHENYLEPHRINE 40 MCG/ML (10ML) SYRINGE FOR IV PUSH (FOR BLOOD PRESSURE SUPPORT): 80.0000 ug | PREFILLED_SYRINGE | INTRAVENOUS | Status: DC | PRN

## 2021-05-06 MED ORDER — OXYTOCIN BOLUS FROM INFUSION
333.0000 mL | Freq: Once | INTRAVENOUS | Status: AC
  Administered 2021-05-07: 333 mL via INTRAVENOUS

## 2021-05-06 MED ORDER — SOD CITRATE-CITRIC ACID 500-334 MG/5ML PO SOLN: 30.0000 mL | ORAL | Status: DC | PRN

## 2021-05-06 MED ORDER — BUTORPHANOL TARTRATE 1 MG/ML IJ SOLN: 1.0000 mg | INTRAMUSCULAR | Status: DC | PRN

## 2021-05-06 MED ORDER — OXYCODONE-ACETAMINOPHEN 5-325 MG PO TABS: 1.0000 | ORAL_TABLET | ORAL | Status: DC | PRN

## 2021-05-06 MED ORDER — LIDOCAINE HCL (PF) 1 % IJ SOLN
INTRAMUSCULAR | Status: DC | PRN
  Administered 2021-05-06: 10 mL via EPIDURAL

## 2021-05-06 MED ORDER — ONDANSETRON HCL 4 MG/2ML IJ SOLN: 4.0000 mg | Freq: Four times a day (QID) | INTRAMUSCULAR | Status: DC | PRN

## 2021-05-06 MED ORDER — LACTATED RINGERS IV SOLN: 500.0000 mL | INTRAVENOUS | Status: DC | PRN

## 2021-05-06 MED ORDER — OXYTOCIN-SODIUM CHLORIDE 30-0.9 UT/500ML-% IV SOLN
2.5000 [IU]/h | INTRAVENOUS | Status: DC
  Administered 2021-05-07: 2.5 [IU]/h via INTRAVENOUS
  Filled 2021-05-06: qty 500

## 2021-05-06 MED ORDER — ACETAMINOPHEN 325 MG PO TABS: 650.0000 mg | ORAL_TABLET | ORAL | Status: DC | PRN

## 2021-05-06 MED ORDER — LACTATED RINGERS IV SOLN: 500.0000 mL | Freq: Once | INTRAVENOUS | Status: DC

## 2021-05-06 MED ORDER — TERBUTALINE SULFATE 1 MG/ML IJ SOLN: 0.2500 mg | Freq: Once | INTRAMUSCULAR | Status: DC | PRN

## 2021-05-06 MED ORDER — OXYTOCIN-SODIUM CHLORIDE 30-0.9 UT/500ML-% IV SOLN
1.0000 m[IU]/min | INTRAVENOUS | Status: DC
  Administered 2021-05-06: 2 m[IU]/min via INTRAVENOUS

## 2021-05-06 MED ORDER — LACTATED RINGERS IV SOLN: INTRAVENOUS | Status: DC

## 2021-05-06 NOTE — Anesthesia Procedure Notes (Signed)
Epidural Patient location during procedure: OB Start time: 05/06/2021 11:20 PM End time: 05/06/2021 11:30 PM  Staffing Anesthesiologist: Mellody Dance, MD Performed: anesthesiologist   Preanesthetic Checklist Completed: patient identified, IV checked, site marked, risks and benefits discussed, monitors and equipment checked, pre-op evaluation and timeout performed  Epidural Patient position: sitting Prep: DuraPrep Patient monitoring: heart rate, cardiac monitor, continuous pulse ox and blood pressure Approach: midline Location: L3-L4 Injection technique: LOR saline  Needle:  Needle type: Tuohy  Needle gauge: 17 G Needle length: 9 cm Needle insertion depth: 7 cm Catheter type: closed end flexible Catheter size: 20 Guage Catheter at skin depth: 13 cm Test dose: negative and Other  Assessment Events: blood not aspirated, injection not painful, no injection resistance and negative IV test  Additional Notes Informed consent obtained prior to proceeding including risk of failure, 1% risk of PDPH, risk of minor discomfort and bruising.  Discussed rare but serious complications including epidural abscess, permanent nerve injury, epidural hematoma.  Discussed alternatives to epidural analgesia and patient desires to proceed.  Timeout performed pre-procedure verifying patient name, procedure, and platelet count.  Patient tolerated procedure well.

## 2021-05-06 NOTE — Interval H&P Note (Signed)
History and Physical Interval Note:  No change since h/p done Pt was unable to be admitted till 3pm today.  She received a dose of cytotec and tolerated well Now 3/60/-3  Plan to start pitocin per protocol AROM when able Pain control prn   05/06/2021 7:48 PM  Becky Scott  has presented today for surgery, with the diagnosis of * No surgery found *.  The various methods of treatment have been discussed with the patient and family. After consideration of risks, benefits and other options for treatment, the patient has consented to  * No surgery found * as a surgical intervention.  The patient's history has been reviewed, patient examined, no change in status, stable for surgery.  I have reviewed the patient's chart and labs.  Questions were answered to the patient's satisfaction.     [name]

## 2021-05-06 NOTE — Anesthesia Preprocedure Evaluation (Signed)
Anesthesia Evaluation  Patient identified by MRN, date of birth, ID band Patient awake    Reviewed: Allergy & Precautions, NPO status , Patient's Chart, lab work & pertinent test results  Airway Mallampati: III  TM Distance: >3 FB Neck ROM: Full    Dental no notable dental hx.    Pulmonary asthma , former smoker,    Pulmonary exam normal breath sounds clear to auscultation       Cardiovascular negative cardio ROS Normal cardiovascular exam Rhythm:Regular Rate:Normal     Neuro/Psych negative neurological ROS  negative psych ROS   GI/Hepatic negative GI ROS, Neg liver ROS,   Endo/Other  Morbid obesity  Renal/GU negative Renal ROS  negative genitourinary   Musculoskeletal negative musculoskeletal ROS (+)   Abdominal   Peds negative pediatric ROS (+)  Hematology negative hematology ROS (+)   Anesthesia Other Findings   Reproductive/Obstetrics (+) Pregnancy                             Anesthesia Physical Anesthesia Plan  ASA: III  Anesthesia Plan: Epidural   Post-op Pain Management:    Induction:   PONV Risk Score and Plan: 2  Airway Management Planned: Natural Airway  Additional Equipment: None  Intra-op Plan:   Post-operative Plan:   Informed Consent: I have reviewed the patients History and Physical, chart, labs and discussed the procedure including the risks, benefits and alternatives for the proposed anesthesia with the patient or authorized representative who has indicated his/her understanding and acceptance.       Plan Discussed with: Anesthesiologist  Anesthesia Plan Comments:         Anesthesia Quick Evaluation

## 2021-05-07 ENCOUNTER — Encounter (HOSPITAL_COMMUNITY): Payer: Self-pay | Admitting: Obstetrics and Gynecology

## 2021-05-07 DIAGNOSIS — Z3A39 39 weeks gestation of pregnancy: Secondary | ICD-10-CM | POA: Diagnosis not present

## 2021-05-07 DIAGNOSIS — Z3490 Encounter for supervision of normal pregnancy, unspecified, unspecified trimester: Secondary | ICD-10-CM | POA: Diagnosis not present

## 2021-05-07 LAB — RPR: RPR Ser Ql: NONREACTIVE

## 2021-05-07 MED ORDER — ZOLPIDEM TARTRATE 5 MG PO TABS
5.0000 mg | ORAL_TABLET | Freq: Every evening | ORAL | Status: DC | PRN
Start: 2021-05-07 — End: 2021-05-08

## 2021-05-07 MED ORDER — TETANUS-DIPHTH-ACELL PERTUSSIS 5-2.5-18.5 LF-MCG/0.5 IM SUSY: 0.5000 mL | PREFILLED_SYRINGE | Freq: Once | INTRAMUSCULAR | Status: DC

## 2021-05-07 MED ORDER — WITCH HAZEL-GLYCERIN EX PADS: 1.0000 "application " | MEDICATED_PAD | CUTANEOUS | Status: DC | PRN

## 2021-05-07 MED ORDER — ACETAMINOPHEN 325 MG PO TABS
650.0000 mg | ORAL_TABLET | ORAL | Status: DC | PRN
  Administered 2021-05-07: 650 mg via ORAL
  Filled 2021-05-07: qty 2

## 2021-05-07 MED ORDER — DIBUCAINE (PERIANAL) 1 % EX OINT: 1.0000 "application " | TOPICAL_OINTMENT | CUTANEOUS | Status: DC | PRN

## 2021-05-07 MED ORDER — DIPHENHYDRAMINE HCL 25 MG PO CAPS: 25.0000 mg | ORAL_CAPSULE | Freq: Four times a day (QID) | ORAL | Status: DC | PRN

## 2021-05-07 MED ORDER — BENZOCAINE-MENTHOL 20-0.5 % EX AERO
1.0000 "application " | INHALATION_SPRAY | CUTANEOUS | Status: DC | PRN
  Administered 2021-05-07: 1 via TOPICAL
  Filled 2021-05-07: qty 56

## 2021-05-07 MED ORDER — PRENATAL MULTIVITAMIN CH
1.0000 | ORAL_TABLET | Freq: Every day | ORAL | Status: DC
  Administered 2021-05-07 – 2021-05-08 (×2): 1 via ORAL
  Filled 2021-05-07 (×2): qty 1

## 2021-05-07 MED ORDER — FAMOTIDINE 20 MG PO TABS
20.0000 mg | ORAL_TABLET | Freq: Two times a day (BID) | ORAL | Status: DC
  Administered 2021-05-07 – 2021-05-08 (×3): 20 mg via ORAL
  Filled 2021-05-07 (×3): qty 1

## 2021-05-07 MED ORDER — OXYCODONE HCL 5 MG PO TABS: 10.0000 mg | ORAL_TABLET | ORAL | Status: DC | PRN

## 2021-05-07 MED ORDER — HYDROXYZINE HCL 25 MG PO TABS: 25.0000 mg | ORAL_TABLET | Freq: Three times a day (TID) | ORAL | Status: DC | PRN

## 2021-05-07 MED ORDER — IBUPROFEN 600 MG PO TABS
600.0000 mg | ORAL_TABLET | Freq: Four times a day (QID) | ORAL | Status: DC
  Administered 2021-05-07 – 2021-05-08 (×6): 600 mg via ORAL
  Filled 2021-05-07 (×6): qty 1

## 2021-05-07 MED ORDER — ONDANSETRON HCL 4 MG/2ML IJ SOLN: 4.0000 mg | INTRAMUSCULAR | Status: DC | PRN

## 2021-05-07 MED ORDER — COCONUT OIL OIL: 1.0000 "application " | TOPICAL_OIL | Status: DC | PRN

## 2021-05-07 MED ORDER — OXYCODONE HCL 5 MG PO TABS
5.0000 mg | ORAL_TABLET | ORAL | Status: DC | PRN
Start: 2021-05-07 — End: 2021-05-08

## 2021-05-07 MED ORDER — ONDANSETRON HCL 4 MG PO TABS: 4.0000 mg | ORAL_TABLET | ORAL | Status: DC | PRN

## 2021-05-07 MED ORDER — BUPIVACAINE HCL (PF) 0.25 % IJ SOLN
INTRAMUSCULAR | Status: DC | PRN
  Administered 2021-05-07: 8 mL via EPIDURAL

## 2021-05-07 MED ORDER — SENNOSIDES-DOCUSATE SODIUM 8.6-50 MG PO TABS
2.0000 | ORAL_TABLET | Freq: Every day | ORAL | Status: DC
  Filled 2021-05-07 (×2): qty 2

## 2021-05-07 MED ORDER — SIMETHICONE 80 MG PO CHEW: 80.0000 mg | CHEWABLE_TABLET | ORAL | Status: DC | PRN

## 2021-05-07 NOTE — Lactation Note (Addendum)
This note was copied from a baby's chart. Lactation Consultation Note  Patient Name: Becky Scott [id number] Date: 05/07/2021 Reason for consult: Initial assessment;Mother's request;Difficult latch;Primapara;1st time breastfeeding;Term Age:30 hours   Infant sleeping according to Mom not able to wake him for a feeding. On arrival, infant in a swaddle. LC reviewed with Mom hand expression and got drops of colostrum to feed to infant while suck training.  Infant has high palate and recessed chin. He keeps his lips tightly clenched. LC offered drops of colostrum via finger feeding began to open his mouth to work on suck training.   He latched at the breast did a few suckles and then stopped. He will take colostrum via spoon and finger feeding. RN, Wendall Papa came in to assist Mom with EBM via spoon feeding. Mom encouraged to keep infant STS where he can lick, taste and smell EBM for latching.   Plan 1. To feed based on cues 8-12x in 24 hr period no more than 4 hrs without attempt at the breasts STS. Mom to offer both breasts and look for swallows with breast compression.                       2. If unable to latch, Mom offer EBM via spoon and finger feeding.              3. Mom set up on manual pump pre pump 5-10 minutes help extend her nipple before latching.              4. I and O sheet reviewed.              5 LC brochure of inpatient and outpatient services reviewed.   LC returned Mom pumped 1 ml offered with curve tip with NS size 20, infant continued to transfer from breast after emptying NS of EBM.  RN, Doran Heater, to set up DEBP since Mom now using NS which can affect milk supply.       Maternal Data Has patient been taught Hand Expression?: Yes Does the patient have breastfeeding experience prior to this delivery?: No  Feeding Mother's Current Feeding Choice: Breast Milk  LATCH Score Latch: Repeated attempts needed to sustain latch, nipple held in mouth throughout feeding,  stimulation needed to elicit sucking reflex.  Audible Swallowing: A few with stimulation  Type of Nipple: Flat (but will evert with stimulation)  Comfort (Breast/Nipple): Soft / non-tender  Hold (Positioning): Assistance needed to correctly position infant at breast and maintain latch.  LATCH Score: 6   Lactation Tools Discussed/Used    Interventions Interventions: Breast feeding basics reviewed;Adjust position;Assisted with latch;Support pillows;Skin to skin;Position options;Education;Breast massage;Expressed milk;Hand express;Pre-pump if needed;Shells;Hand pump  Discharge Pump: Manual WIC Program: Yes  Consult Status Consult Status: Follow-up Date: 05/08/21 Follow-up type: In-patient    Becky Scott  Nicholson-Springer 05/07/2021, 6:32 PM

## 2021-05-07 NOTE — Lactation Note (Signed)
This note was copied from a baby's chart. Lactation Consultation Note  Patient Name: Becky Scott [id number] Date: 05/07/2021 Reason for consult: Initial assessment;Term;1st time breastfeeding Age:30 hours  Initial visit at 2 hours of life. Mom is a P2; she did not breastfeed her 1st child. Infant is currently sleeping; Mom is ordering breakfast.  Mom asked about infant cues, which I discussed. Mom will call for me to return when infant cues.   Lurline Hare [facility] 05/07/2021, 8:18 AM

## 2021-05-07 NOTE — Progress Notes (Signed)
POSTPARTUM PROGRESS NOTE  Post Partum Day #0  Subjective:  No acute events since delivery.  Pt denies problems with ambulating, voiding or po intake.  She denies nausea or vomiting.  Pain is well controlled.   Lochia Small. Patient alone in room, asked about FOB. Patient states he left during labor and he "will not be back, I do not have time for ignorance." Patient states she has good support at home via her mother  Objective: Blood pressure 127/65, pulse 79, temperature 97.6 F (36.4 C), temperature source Oral, resp. rate 17, height 5\' 3"  (1.6 m), weight 125.2 kg, last menstrual period 08/04/2020, SpO2 100 %, unknown if currently breastfeeding.  Physical Exam:  General: alert, cooperative and no distress Lochia:normal flow Chest: CTAB Heart: RRR no m/r/g Abdomen: +BS, soft, nontender Uterine Fundus: firm, 2cm below umbilicus Extremities: neg edema, neg calf TTP BL, neg Homans BL  Recent Labs    05/06/21 1530  HGB 11.7*  HCT 34.8*    Assessment/Plan:  ASSESSMENT: Becky Scott is a 30 y.o. 30 s/p SVD @ [redacted]w[redacted]d. PNC c/b seasonal asthma, RNI.   Plan for discharge tomorrow, Breastfeeding, Lactation consult and Circumcision prior to discharge  circ tomorrow pending feeds - baby has not eaten since 9AM, reviewed importance of PO intake and voiding for baby boy Will place social work consult to ensure appropriate supply at home    LOS: 1 day

## 2021-05-07 NOTE — Progress Notes (Signed)
Patient ID: Becky Scott, female   DOB: 08/18/1991, 30 y.o.   MRN: [id number] Pt comfortable with epidural but still slightly anxious - unable to sleep VSS EFM - 140, cat 1 TOCO - ctxs 2-74mins SVE- 5/70/-2; IUPC and FSE in place ; adequate mvus   A/P: Progressing in labor on pitocin          Anticipate svd

## 2021-05-07 NOTE — Anesthesia Postprocedure Evaluation (Signed)
Anesthesia Post Note  Patient: Becky Scott  Procedure(s) Performed: AN AD HOC LABOR EPIDURAL     Patient location during evaluation: Mother Baby Anesthesia Type: Epidural Level of consciousness: awake Pain management: satisfactory to patient Vital Signs Assessment: post-procedure vital signs reviewed and stable Respiratory status: spontaneous breathing Cardiovascular status: stable Anesthetic complications: no   No complications documented.  Last Vitals:  Vitals:   05/07/21 0902 05/07/21 1320  BP: 131/78 127/65  Pulse: 93 79  Resp: 17 17  Temp: 36.7 C 36.4 C  SpO2:      Last Pain:  Vitals:   05/07/21 1320  TempSrc: Oral  PainSc: 6    Pain Goal:                Epidural/Spinal Function Cutaneous sensation: Normal sensation (05/07/21 1556)  Cephus Shelling

## 2021-05-08 LAB — CBC
HCT: 29 % — ABNORMAL LOW (ref 36.0–46.0)
Hemoglobin: 9.3 g/dL — ABNORMAL LOW (ref 12.0–15.0)
MCH: 28.2 pg (ref 26.0–34.0)
MCHC: 32.1 g/dL (ref 30.0–36.0)
MCV: 87.9 fL (ref 80.0–100.0)
Platelets: 189 10*3/uL (ref 150–400)
RBC: 3.3 MIL/uL — ABNORMAL LOW (ref 3.87–5.11)
RDW: 14.1 % (ref 11.5–15.5)
WBC: 12.5 10*3/uL — ABNORMAL HIGH (ref 4.0–10.5)
nRBC: 0 % (ref 0.0–0.2)

## 2021-05-08 MED ORDER — IBUPROFEN 600 MG PO TABS: 600.0000 mg | ORAL_TABLET | Freq: Four times a day (QID) | ORAL | 0 refills | Status: DC

## 2021-05-08 NOTE — Discharge Summary (Signed)
Postpartum Discharge Summary      Patient Name: Becky Scott DOB: Jul 19, 1991 MRN: [id number]  Date of admission: 05/06/2021 Delivery date:05/07/2021  Delivering provider: Pryor Ochoa [facility]  Date of discharge: 05/08/2021  Admitting diagnosis: Term pregnancy [Z34.90] Intrauterine pregnancy: [redacted]w[redacted]d     Secondary diagnosis:  Active Problems:   Term pregnancy   SVD (spontaneous vaginal delivery)     Discharge diagnosis: Term Pregnancy Delivered                                               Hospital course: Induction of Labor With Vaginal Delivery   30 y.o. yo [name] at [redacted]w[redacted]d was admitted to the hospital 05/06/2021 for induction of labor.  Indication for induction: Favorable cervix at term.  Patient had an uncomplicated labor course as follows: Membrane Rupture Time/Date: 10:34 PM ,05/06/2021   Delivery Method:Vaginal, Spontaneous  Episiotomy: None  Lacerations:  Vaginal  Details of delivery can be found in separate delivery note.  Patient had a routine postpartum course. Patient is discharged home 05/08/21.  Newborn Data: Birth date:05/07/2021  Birth time:5:36 AM  Gender:Female  Living status:Living  Apgars:9 ,9  Weight:3359 g    Physical exam  Vitals:   05/07/21 1709 05/07/21 2040 05/08/21 0548 05/08/21 1450  BP: 108/71 109/86 138/75 133/76  Pulse: 64 78 79 71  Resp: 17 18 16 18   Temp: 97.6 F (36.4 C) 97.7 F (36.5 C) 97.9 F (36.6 C) 98.5 F (36.9 C)  TempSrc: Oral Oral Oral Oral  SpO2:  99% 100% 100%  Weight:      Height:       General: alert Lochia: appropriate Uterine Fundus: firm  Labs: Lab Results  Component Value Date   WBC 12.5 (H) 05/08/2021   HGB 9.3 (L) 05/08/2021   HCT 29.0 (L) 05/08/2021   MCV 87.9 05/08/2021   PLT 189 05/08/2021   CMP Latest Ref Rng & Units 12/17/2020  Glucose 70 - 99 mg/dL 77  BUN 6 - 20 mg/dL 6  Creatinine 12/19/2020 - 1.47 mg/dL 8.29  Sodium 5.62 - 130 mmol/L 137  Potassium 3.5 - 5.1 mmol/L 4.2  Chloride 98 - 111  mmol/L 104  CO2 22 - 32 mmol/L 24  Calcium 8.9 - 10.3 mg/dL 8.3(L)  Total Protein 6.5 - 8.1 g/dL 865)  Total Bilirubin 0.3 - 1.2 mg/dL 0.6  Alkaline Phos 38 - 126 U/L 42  AST 15 - 41 U/L 25  ALT 0 - 44 U/L 21   Edinburgh Score: Edinburgh Postnatal Depression Scale Screening Tool 05/07/2021  I have been able to laugh and see the funny side of things. 0  I have looked forward with enjoyment to things. 0  I have blamed myself unnecessarily when things went wrong. 1  I have been anxious or worried for no good reason. 0  I have felt scared or panicky for no good reason. 0  Things have been getting on top of me. 1  I have been so unhappy that I have had difficulty sleeping. 1  I have felt sad or miserable. 1  I have been so unhappy that I have been crying. 1  The thought of harming myself has occurred to me. 0  Edinburgh Postnatal Depression Scale Total 5      After visit meds:  Allergies as of 05/08/2021   No Known Allergies  Medication List    TAKE these medications   acetaminophen 325 MG tablet Commonly known as: TYLENOL Take 650 mg every 6 (six) hours as needed by mouth for headache.   albuterol 108 (90 Base) MCG/ACT inhaler Commonly known as: VENTOLIN HFA Inhale 1-2 puffs into the lungs every 6 (six) hours as needed for wheezing or shortness of breath.   famotidine 20 MG tablet Commonly known as: PEPCID Take 1 tablet (20 mg total) by mouth 2 (two) times daily.   hydrOXYzine 25 MG tablet Commonly known as: ATARAX/VISTARIL Take 1 tablet (25 mg total) by mouth at bedtime as needed.   ibuprofen 600 MG tablet Commonly known as: ADVIL Take 1 tablet (600 mg total) by mouth every 6 (six) hours.   loratadine 10 MG tablet Commonly known as: CLARITIN Take 10 mg by mouth daily.   ondansetron 8 MG disintegrating tablet Commonly known as: Zofran ODT Take 1 tablet (8 mg total) by mouth every 8 (eight) hours as needed for nausea or vomiting.        Discharge home  in stable condition Infant Feeding: Breast Infant Disposition:home with mother Discharge instruction: per After Visit Summary and Postpartum booklet. Activity: Advance as tolerated. Pelvic rest for 6 weeks.  Diet: routine diet  Postpartum Appointment:6 weeks Follow up Visit:  Follow-up Information    Becky Areola, DO. Schedule an appointment as soon as possible for a visit in 6 week(s).   Specialty: Obstetrics and Gynecology Contact information: [address] [place] [address] [phone]                   05/08/2021 Zenaida Niece, MD

## 2021-05-08 NOTE — Discharge Instructions (Signed)
As per discharge pamphlet °

## 2021-05-08 NOTE — Progress Notes (Signed)
PPD #1 No problems Afeb, VSS Fundus firm, NT at U-1 Continue routine postpartum care.  Nursery would not let me do circumcision this am, will try this pm and possible d/c home

## 2021-05-08 NOTE — Social Work (Signed)
CSW received consult due to FOB leaving during labor and delivery.    CSW is screening out referral since there is no evidence to support need to address at this time. CSW spoke with RN and requested to be consulted at MOB request. RN expressed no concerns at this time.  Please contact CSW by MOB's request, if it is noted that history begins to impact patient care, if there are concerns about bonding, or if MOB scores 10 or greater/yes to question 10 on the Edinburgh Postnatal Depression Scale.    Marylynn Rigdon Mehrer, MSW, LCSWA Clinical Social Work [facility] [phone] 

## 2021-05-08 NOTE — Lactation Note (Signed)
This note was copied from a baby's chart. Lactation Consultation Note  Patient Name: Boy Kelsey Durflinger RSWNI'O Date: 05/08/2021 Reason for consult: Follow-up assessment;Term;Infant weight loss;Nipple pain/trauma Age:30 hours  Visited with mom of 32 hours old FT female, she's a P2 but this is her first time BF. Mom is complaining of sore nipples and already using a NS # 20 and comfort gels. She has been pre-pumping and her nipples are now sore, advised mom to add some lubrication prior pumping once she gets home (hospital is out of coconut oil) she said she'll get some food grade coconut oil in addition to her colostrum.  Offered latch assistance but baby was asleep, mom said he just fed and was just waiting for baby to be taken for his circumcision before they go home; baby is at 4% weight loss. Asked mom to call for assistance when needed. Reviewed discharge education, pumping schedule, feeding cues, pacifier use (she had one in the room), lactogenesis II and cluster feeding.   Feeding plan:  1. Encouraged mom to continue putting baby to breast 8-12 times/24 hours or sooner if feeding cues are present using NS PRN 2. She'll continue pre-pumping prior feedings at the breast; and will start using coconut oil once she gets home (she didn't like the DEBP and prefers the hand pump) 3. She'll continue supplementing baby with any amount of EBM she may get  GOB was mom's support person at the time of [facility] consultation. Family reported all questions and concerns were answered, they're both aware of LC OP services and will call PRN.  Maternal Data    Feeding Mother's Current Feeding Choice: Breast Milk  LATCH Score Latch: Grasps breast easily, tongue down, lips flanged, rhythmical sucking.  Audible Swallowing: A few with stimulation  Type of Nipple: Everted at rest and after stimulation  Comfort (Breast/Nipple): Filling, red/small blisters or bruises, mild/mod discomfort  Hold (Positioning): No  assistance needed to correctly position infant at breast.  LATCH Score: 8   Lactation Tools Discussed/Used Tools: Pump;Nipple Shields Nipple shield size: 20 Breast pump type: Double-Electric Breast Pump;Manual Pump Education: Setup, frequency, and cleaning;Milk Storage Reason for Pumping: using NS # 20 Pumping frequency: q 3 hours Pumped volume: 5 mL  Interventions Interventions: Breast feeding basics reviewed;DEBP  Discharge Discharge Education: Engorgement and breast care;Warning signs for feeding baby;Outpatient recommendation Pump: Manual;DEBP  Consult Status Consult Status: Complete Date: 05/08/21 Follow-up type: Call as needed    Mayte Diers Venetia Constable 05/08/2021, 2:28 PM

## 2021-05-20 DIAGNOSIS — Z419 Encounter for procedure for purposes other than remedying health state, unspecified: Secondary | ICD-10-CM | POA: Diagnosis not present

## 2021-06-19 DIAGNOSIS — Z419 Encounter for procedure for purposes other than remedying health state, unspecified: Secondary | ICD-10-CM | POA: Diagnosis not present

## 2021-06-29 ENCOUNTER — Telehealth (HOSPITAL_COMMUNITY): Payer: Self-pay | Admitting: Lactation Services

## 2021-06-29 NOTE — Telephone Encounter (Signed)
Mother called with concerns related to her 42 month old son wanting to feed every hour for the past 1 1/2 weeks.  He does not seem satisfied.  Mother's milk supply has decreased but she has started pumping.  Discussed possible reasons for baby's current situation and suggested mother pump after breast feeding to help increase her supply.  Baby has been voiding/stooling well; no rash.  Mother has an OP visit tomorrow with the [facility] office in [facility] for an evaluation.  Praised mother for making this appointment and following through.  I suggested that seeing baby feed at the breast should help the lactation consultant evaluate.  Mother satisfied after conversation.  No further questions at this time.

## 2021-07-09 DIAGNOSIS — Z3009 Encounter for other general counseling and advice on contraception: Secondary | ICD-10-CM | POA: Diagnosis not present

## 2021-07-09 DIAGNOSIS — Z1389 Encounter for screening for other disorder: Secondary | ICD-10-CM | POA: Diagnosis not present

## 2021-07-20 DIAGNOSIS — Z419 Encounter for procedure for purposes other than remedying health state, unspecified: Secondary | ICD-10-CM | POA: Diagnosis not present

## 2021-08-20 DIAGNOSIS — Z419 Encounter for procedure for purposes other than remedying health state, unspecified: Secondary | ICD-10-CM | POA: Diagnosis not present

## 2021-09-19 DIAGNOSIS — Z419 Encounter for procedure for purposes other than remedying health state, unspecified: Secondary | ICD-10-CM | POA: Diagnosis not present

## 2021-10-20 DIAGNOSIS — Z419 Encounter for procedure for purposes other than remedying health state, unspecified: Secondary | ICD-10-CM | POA: Diagnosis not present

## 2021-11-19 DIAGNOSIS — Z419 Encounter for procedure for purposes other than remedying health state, unspecified: Secondary | ICD-10-CM | POA: Diagnosis not present

## 2021-12-20 DIAGNOSIS — Z419 Encounter for procedure for purposes other than remedying health state, unspecified: Secondary | ICD-10-CM | POA: Diagnosis not present

## 2022-01-20 DIAGNOSIS — Z419 Encounter for procedure for purposes other than remedying health state, unspecified: Secondary | ICD-10-CM | POA: Diagnosis not present

## 2022-02-17 DIAGNOSIS — Z419 Encounter for procedure for purposes other than remedying health state, unspecified: Secondary | ICD-10-CM | POA: Diagnosis not present

## 2022-03-20 DIAGNOSIS — Z419 Encounter for procedure for purposes other than remedying health state, unspecified: Secondary | ICD-10-CM | POA: Diagnosis not present

## 2022-04-19 DIAGNOSIS — Z419 Encounter for procedure for purposes other than remedying health state, unspecified: Secondary | ICD-10-CM | POA: Diagnosis not present

## 2022-05-20 DIAGNOSIS — Z419 Encounter for procedure for purposes other than remedying health state, unspecified: Secondary | ICD-10-CM | POA: Diagnosis not present

## 2022-06-19 DIAGNOSIS — Z419 Encounter for procedure for purposes other than remedying health state, unspecified: Secondary | ICD-10-CM | POA: Diagnosis not present

## 2022-06-21 DIAGNOSIS — M722 Plantar fascial fibromatosis: Secondary | ICD-10-CM | POA: Diagnosis not present

## 2022-06-21 DIAGNOSIS — K2100 Gastro-esophageal reflux disease with esophagitis, without bleeding: Secondary | ICD-10-CM | POA: Diagnosis not present

## 2022-06-21 DIAGNOSIS — L819 Disorder of pigmentation, unspecified: Secondary | ICD-10-CM | POA: Diagnosis not present

## 2022-07-20 DIAGNOSIS — Z419 Encounter for procedure for purposes other than remedying health state, unspecified: Secondary | ICD-10-CM | POA: Diagnosis not present

## 2022-08-03 DIAGNOSIS — L821 Other seborrheic keratosis: Secondary | ICD-10-CM | POA: Diagnosis not present

## 2022-08-03 DIAGNOSIS — D485 Neoplasm of uncertain behavior of skin: Secondary | ICD-10-CM | POA: Diagnosis not present

## 2022-08-20 DIAGNOSIS — Z419 Encounter for procedure for purposes other than remedying health state, unspecified: Secondary | ICD-10-CM | POA: Diagnosis not present

## 2022-08-31 ENCOUNTER — Encounter: Payer: Self-pay | Admitting: Gastroenterology

## 2022-09-18 DIAGNOSIS — D485 Neoplasm of uncertain behavior of skin: Secondary | ICD-10-CM | POA: Diagnosis not present

## 2022-09-19 DIAGNOSIS — Z419 Encounter for procedure for purposes other than remedying health state, unspecified: Secondary | ICD-10-CM | POA: Diagnosis not present

## 2022-09-23 DIAGNOSIS — Z8669 Personal history of other diseases of the nervous system and sense organs: Secondary | ICD-10-CM | POA: Insufficient documentation

## 2022-09-23 DIAGNOSIS — K2100 Gastro-esophageal reflux disease with esophagitis, without bleeding: Secondary | ICD-10-CM | POA: Insufficient documentation

## 2022-09-23 DIAGNOSIS — M722 Plantar fascial fibromatosis: Secondary | ICD-10-CM | POA: Insufficient documentation

## 2022-09-29 ENCOUNTER — Encounter: Payer: Self-pay | Admitting: Gastroenterology

## 2022-09-29 ENCOUNTER — Ambulatory Visit (INDEPENDENT_AMBULATORY_CARE_PROVIDER_SITE_OTHER): Payer: Medicaid Other | Admitting: Gastroenterology

## 2022-09-29 VITALS — BP 138/70 | HR 76 | Ht 63.0 in

## 2022-09-29 DIAGNOSIS — K219 Gastro-esophageal reflux disease without esophagitis: Secondary | ICD-10-CM | POA: Insufficient documentation

## 2022-09-29 MED ORDER — PANTOPRAZOLE SODIUM 40 MG PO TBEC: 40.0000 mg | DELAYED_RELEASE_TABLET | Freq: Two times a day (BID) | ORAL | 3 refills | Status: DC

## 2022-09-29 NOTE — Patient Instructions (Addendum)
If you are age 31 or younger, your body mass index should be between 19-25. Your Body mass index is 48.91 kg/m. If this is out of the aformentioned range listed, please consider follow up with your Primary Care Provider.  ________________________________________________________  The [facility] providers would like to encourage you to use [facility] to communicate with providers for non-urgent requests or questions.  Due to long hold times on the telephone, sending your provider a message by [facility] may be a faster and more efficient way to get a response.  Please allow 48 business hours for a response.  Please remember that this is for non-urgent requests.  _______________________________________________________  We have sent the following medications to your pharmacy for you to pick up at your convenience:  START: pantoprazole 40mg  one tablet twice daily.  CONTINUE: pepcid 20mg  at night  You are scheduled to follow up on 11-16-22 at 11:30am.  Thank you for entrusting me with your care and choosing [facility].  Alonza Bogus, PA-C

## 2022-09-29 NOTE — Addendum Note (Signed)
Addended by: Stevan Born on: 09/29/2022 04:02 PM   Modules accepted: Orders

## 2022-09-29 NOTE — Progress Notes (Signed)
09/29/2022 Becky Scott [id number] 07/19/1991   HISTORY OF PRESENT ILLNESS: This is a 31 year old female who is new to our office.  She has been referred here by Therese Sarah, NP, for evaluation of GERD.  She tells me that she is started having severe heartburn a couple of years ago.  She says that it has worsened and is to the point that it hurts and is burning all the time up through her esophagus.  When she belches she brings acid up.  She says that she has been sitting up to sleep for the past 2 years.  She drinks 1 cup of coffee in the morning.  Mostly drinks flavored water.  Drinks occasional tea or dark soda.  Does not smoke or use any alcohol.  Has been avoiding spicy and citrus/acidic type foods as those tend to make her symptoms worse.  She has only tried famotidine 20 mg twice daily and that is not helping.  She was given Carafate to take 4 times a day, but it made her vomit and decreased her milk supply.  Has not tried any type of PPI therapy.  No dysphagia.   Past Medical History:  Diagnosis Date   Allergy    Asthma    seasonal    GERD with esophagitis    Trichomonas    Past Surgical History:  Procedure Laterality Date   NO PAST SURGERIES      reports that she has quit smoking. Her smoking use included cigarettes. She has a 1.00 pack-year smoking history. She has never used smokeless tobacco. She reports that she does not currently use alcohol. She reports that she does not use drugs. family history includes Asthma in her mother; Breast cancer in an other family member; Breast cancer (age of onset: 53) in her mother; Cervical cancer in her paternal grandmother; Colon polyps in her paternal grandfather; High blood pressure in her father, maternal grandfather, maternal grandmother, mother, paternal grandfather, and paternal grandmother; Lung cancer (age of onset: 17) in her maternal grandmother; Other in her maternal aunt; Prostate cancer in her father and paternal  grandfather; Thyroid cancer (age of onset: 23) in her mother. No Known Allergies    Outpatient Encounter Medications as of 09/29/2022  Medication Sig   Prenat-Fe Poly-Methfol-FA-DHA (VITAFOL FE+ PO) Take 1 tablet by mouth daily.   Prenatal Vit-FePoly-FA-DHA (VITAFOL-ONE) 29-1-200 MG CAPS Take 1 capsule by mouth daily.   famotidine (PEPCID) 20 MG tablet Take 1 tablet (20 mg total) by mouth 2 (two) times daily.   [DISCONTINUED] acetaminophen (TYLENOL) 325 MG tablet Take 650 mg every 6 (six) hours as needed by mouth for headache. (Patient not taking: Reported on 09/29/2022)   [DISCONTINUED] albuterol (VENTOLIN HFA) 108 (90 Base) MCG/ACT inhaler Inhale 1-2 puffs into the lungs every 6 (six) hours as needed for wheezing or shortness of breath.   [DISCONTINUED] ALBUTEROL IN Inhale into the lungs.   [DISCONTINUED] hydrOXYzine (ATARAX/VISTARIL) 25 MG tablet Take 1 tablet (25 mg total) by mouth at bedtime as needed. (Patient not taking: Reported on 09/29/2022)   [DISCONTINUED] ibuprofen (ADVIL) 600 MG tablet Take 1 tablet (600 mg total) by mouth every 6 (six) hours. (Patient not taking: Reported on 09/29/2022)   [DISCONTINUED] loratadine (CLARITIN) 10 MG tablet Take 10 mg by mouth daily. (Patient not taking: Reported on 09/29/2022)   [DISCONTINUED] ondansetron (ZOFRAN ODT) 8 MG disintegrating tablet Take 1 tablet (8 mg total) by mouth every 8 (eight) hours as needed for nausea or vomiting. (Patient  not taking: Reported on 09/29/2022)   [DISCONTINUED] sucralfate (CARAFATE) 1 g tablet Take 1 g by mouth 4 (four) times daily. (Patient not taking: Reported on 09/29/2022)   No facility-administered encounter medications on file as of 09/29/2022.     REVIEW OF SYSTEMS  : All other systems reviewed and negative except where noted in the History of Present Illness.   PHYSICAL EXAM: BP 108/70 (BP Location: Left Arm, Patient Position: Sitting, Cuff Size: Large)   Pulse 76   SpO2 92%  General: Well developed  white female in no acute distress Head: Normocephalic and atraumatic Eyes:  Sclerae anicteric, conjunctiva pink. Ears: Normal auditory acuity Lungs: Clear throughout to auscultation; no W/R/R. Heart: Regular rate and rhythm; no M/R/G. Abdomen: Soft, non-distended.  BS present. Non-tender. Musculoskeletal: Symmetrical with no gross deformities  Skin: No lesions on visible extremities Extremities: No edema  Neurological: Alert oriented x 4, grossly non-focal Psychological:  Alert and cooperative. Normal mood and affect  ASSESSMENT AND PLAN: *GERD: Severe symptoms for past couple of years.  Famotidine not helping.  Has not tried PPI.  Was placed on Carafate, but it made her vomit and decreased her breast milk supply.  We will going to max out her acid reflux regimen with pantoprazole 40 mg twice daily and then famotidine 20 mg at bedtime.  New prescription sent to pharmacy.  Pantoprazole is safe with breast-feeding.  We discussed EGD, but she would like to hold off for now as she is still breast-feeding and does not pump so would prefer to avoid anything that will interfere with her breast-feeding.  I am going to bring her back in about 5 to 6 weeks for reassessment.  We also discussed GERD dietary changes.   CC:  Osa Craver, NP

## 2022-10-05 NOTE — Progress Notes (Signed)
Agree with assessment/plan.  Raj Arrion Burruel, MD [facility] [phone]  

## 2022-10-11 DIAGNOSIS — Z130 Encounter for screening for diseases of the blood and blood-forming organs and certain disorders involving the immune mechanism: Secondary | ICD-10-CM | POA: Diagnosis not present

## 2022-10-11 DIAGNOSIS — F418 Other specified anxiety disorders: Secondary | ICD-10-CM | POA: Diagnosis not present

## 2022-10-11 DIAGNOSIS — Z1389 Encounter for screening for other disorder: Secondary | ICD-10-CM | POA: Diagnosis not present

## 2022-10-11 DIAGNOSIS — Z01419 Encounter for gynecological examination (general) (routine) without abnormal findings: Secondary | ICD-10-CM | POA: Diagnosis not present

## 2022-10-20 DIAGNOSIS — Z419 Encounter for procedure for purposes other than remedying health state, unspecified: Secondary | ICD-10-CM | POA: Diagnosis not present

## 2022-10-30 DIAGNOSIS — D2239 Melanocytic nevi of other parts of face: Secondary | ICD-10-CM | POA: Diagnosis not present

## 2022-10-30 DIAGNOSIS — D485 Neoplasm of uncertain behavior of skin: Secondary | ICD-10-CM | POA: Diagnosis not present

## 2022-11-02 DIAGNOSIS — F418 Other specified anxiety disorders: Secondary | ICD-10-CM | POA: Diagnosis not present

## 2022-11-09 DIAGNOSIS — F411 Generalized anxiety disorder: Secondary | ICD-10-CM | POA: Diagnosis not present

## 2022-11-09 DIAGNOSIS — F321 Major depressive disorder, single episode, moderate: Secondary | ICD-10-CM | POA: Diagnosis not present

## 2022-11-16 ENCOUNTER — Ambulatory Visit: Payer: Medicaid Other | Admitting: Gastroenterology

## 2022-11-16 DIAGNOSIS — F321 Major depressive disorder, single episode, moderate: Secondary | ICD-10-CM | POA: Diagnosis not present

## 2022-11-16 DIAGNOSIS — F411 Generalized anxiety disorder: Secondary | ICD-10-CM | POA: Diagnosis not present

## 2022-11-19 DIAGNOSIS — Z419 Encounter for procedure for purposes other than remedying health state, unspecified: Secondary | ICD-10-CM | POA: Diagnosis not present

## 2022-11-30 DIAGNOSIS — F411 Generalized anxiety disorder: Secondary | ICD-10-CM | POA: Diagnosis not present

## 2022-11-30 DIAGNOSIS — F321 Major depressive disorder, single episode, moderate: Secondary | ICD-10-CM | POA: Diagnosis not present

## 2022-12-07 ENCOUNTER — Ambulatory Visit: Payer: Medicaid Other | Admitting: Gastroenterology

## 2022-12-07 DIAGNOSIS — F411 Generalized anxiety disorder: Secondary | ICD-10-CM | POA: Diagnosis not present

## 2022-12-07 DIAGNOSIS — F321 Major depressive disorder, single episode, moderate: Secondary | ICD-10-CM | POA: Diagnosis not present

## 2022-12-20 DIAGNOSIS — Z419 Encounter for procedure for purposes other than remedying health state, unspecified: Secondary | ICD-10-CM | POA: Diagnosis not present

## 2022-12-27 DIAGNOSIS — F321 Major depressive disorder, single episode, moderate: Secondary | ICD-10-CM | POA: Diagnosis not present

## 2022-12-27 DIAGNOSIS — F411 Generalized anxiety disorder: Secondary | ICD-10-CM | POA: Diagnosis not present

## 2023-01-03 DIAGNOSIS — F411 Generalized anxiety disorder: Secondary | ICD-10-CM | POA: Diagnosis not present

## 2023-01-03 DIAGNOSIS — F321 Major depressive disorder, single episode, moderate: Secondary | ICD-10-CM | POA: Diagnosis not present

## 2023-01-05 ENCOUNTER — Encounter: Payer: Self-pay | Admitting: Gastroenterology

## 2023-01-05 ENCOUNTER — Ambulatory Visit (INDEPENDENT_AMBULATORY_CARE_PROVIDER_SITE_OTHER): Payer: Medicaid Other | Admitting: Gastroenterology

## 2023-01-05 VITALS — BP 128/82 | HR 85 | Ht 63.0 in | Wt 312.2 lb

## 2023-01-05 DIAGNOSIS — K219 Gastro-esophageal reflux disease without esophagitis: Secondary | ICD-10-CM | POA: Diagnosis not present

## 2023-01-05 MED ORDER — PANTOPRAZOLE SODIUM 40 MG PO TBEC: 40.0000 mg | DELAYED_RELEASE_TABLET | Freq: Two times a day (BID) | ORAL | 11 refills | Status: DC

## 2023-01-05 NOTE — Progress Notes (Signed)
01/05/2023 Becky Scott [id number] 10-Nov-1991   HISTORY OF PRESENT ILLNESS:  This is a pleasant 32 year old female who is here for follow-up of GERD.  She was seen by me back in October for severe reflux symptoms.  See my note from 09/29/2022 for further details.  I started her on pantoprazole 40 mg BID and pepcid 20 mg at bedtime.  She says that since starting that regimen she has not experienced any issues at all.  Says that she is doing great.  No complaints today.  Past Medical History:  Diagnosis Date   Allergy    Asthma    seasonal    GERD with esophagitis    Trichomonas    Past Surgical History:  Procedure Laterality Date   NO PAST SURGERIES      reports that she has quit smoking. Her smoking use included cigarettes. She has a 1.00 pack-year smoking history. She has never used smokeless tobacco. She reports that she does not currently use alcohol. She reports that she does not use drugs. family history includes Asthma in her mother; Breast cancer in an other family member; Breast cancer (age of onset: 63) in her mother; Cervical cancer in her paternal grandmother; Colon polyps in her paternal grandfather; High blood pressure in her father, maternal grandfather, maternal grandmother, mother, paternal grandfather, and paternal grandmother; Lung cancer (age of onset: 59) in her maternal grandmother; Other in her maternal aunt; Prostate cancer in her father and paternal grandfather; Thyroid cancer (age of onset: 59) in her mother. No Known Allergies    Outpatient Encounter Medications as of 01/05/2023  Medication Sig   famotidine (PEPCID) 20 MG tablet Take 20 mg by mouth daily.   Multiple Vitamin (MULTIVITAMIN) tablet Take 1 tablet by mouth daily.   pantoprazole (PROTONIX) 40 MG tablet Take 1 tablet (40 mg total) by mouth 2 (two) times daily.   sertraline (ZOLOFT) 50 MG tablet Take 75 mg by mouth daily.   [DISCONTINUED] Prenat-Fe Poly-Methfol-FA-DHA (VITAFOL FE+ PO) Take 1  tablet by mouth daily.   [DISCONTINUED] Prenatal Vit-FePoly-FA-DHA (VITAFOL-ONE) 29-1-200 MG CAPS Take 1 capsule by mouth daily.   [DISCONTINUED] ALBUTEROL IN Inhale into the lungs.   [DISCONTINUED] famotidine (PEPCID) 20 MG tablet Take 1 tablet (20 mg total) by mouth 2 (two) times daily. (Patient taking differently: Take 20 mg by mouth daily.)   No facility-administered encounter medications on file as of 01/05/2023.    REVIEW OF SYSTEMS  : All other systems reviewed and negative except where noted in the History of Present Illness.   PHYSICAL EXAM: BP 128/82   Pulse 85   Ht 5\' 3"  (1.6 m)   Wt (!) 312 lb 4 oz (141.6 kg)   SpO2 99%   BMI 55.31 kg/m  General: Well developed white female in no acute distress Head: Normocephalic and atraumatic Eyes:  Sclerae anicteric, conjunctiva pink. Ears: Normal auditory acuity Lungs: Clear throughout to auscultation; no W/R/R. Heart: Regular rate and rhythm; no M/R/G. Abdomen: Soft, non-distended.  BS present.  Non-tender. Musculoskeletal: Symmetrical with no gross deformities  Skin: No lesions on visible extremities Extremities: No edema  Neurological: Alert oriented x 4, grossly non-focal Psychological:  Alert and cooperative. Normal mood and affect  ASSESSMENT AND PLAN: *GERD:  Completely resolved with pantoprazole 40 mg BID and pepcid 20 mg at bedtime.  No issues since starting this regimen 4 months ago.  Will try to decrease pantoprazole to once daily and continue pepcid at bedtime.  If  starts with symptoms again then can increase back to BID.  New prescription sent to pharmacy.  Follow-up in a year or sooner if needed.     CC:  No ref. provider found

## 2023-01-05 NOTE — Patient Instructions (Addendum)
If you are age 32 or younger, your body mass index should be between 19-25. Your Body mass index is 55.31 kg/m. If this is out of the aformentioned range listed, please consider follow up with your Primary Care Provider.  ________________________________________________________  The [facility] providers would like to encourage you to use [facility] to communicate with providers for non-urgent requests or questions.  Due to long hold times on the telephone, sending your provider a message by [facility] may be a faster and more efficient way to get a response.  Please allow 48 business hours for a response.  Please remember that this is for non-urgent requests.  _______________________________________________________  We have sent the following medications to your pharmacy for you to pick up at your convenience:  CONTINUE: pantoprazole 40mg  one tablet twice daily.  The goal is decrease pantoprazole to 40mg  daily.  CONTINUE: Pepcid at bedtime.  You can follow up in one year or sooner if needed.   Thank you for entrusting me with your care and choosing [facility].  Zenaida Deed, PA-C

## 2023-01-09 NOTE — Progress Notes (Signed)
Agree with assessment/plan.  Raj Pattie Flaharty, MD [facility] [phone]  

## 2023-01-10 DIAGNOSIS — F411 Generalized anxiety disorder: Secondary | ICD-10-CM | POA: Diagnosis not present

## 2023-01-10 DIAGNOSIS — F321 Major depressive disorder, single episode, moderate: Secondary | ICD-10-CM | POA: Diagnosis not present

## 2023-01-20 DIAGNOSIS — F411 Generalized anxiety disorder: Secondary | ICD-10-CM | POA: Diagnosis not present

## 2023-01-20 DIAGNOSIS — F321 Major depressive disorder, single episode, moderate: Secondary | ICD-10-CM | POA: Diagnosis not present

## 2023-01-20 DIAGNOSIS — Z419 Encounter for procedure for purposes other than remedying health state, unspecified: Secondary | ICD-10-CM | POA: Diagnosis not present

## 2023-01-31 DIAGNOSIS — F321 Major depressive disorder, single episode, moderate: Secondary | ICD-10-CM | POA: Diagnosis not present

## 2023-01-31 DIAGNOSIS — F411 Generalized anxiety disorder: Secondary | ICD-10-CM | POA: Diagnosis not present

## 2023-02-07 DIAGNOSIS — F321 Major depressive disorder, single episode, moderate: Secondary | ICD-10-CM | POA: Diagnosis not present

## 2023-02-07 DIAGNOSIS — F411 Generalized anxiety disorder: Secondary | ICD-10-CM | POA: Diagnosis not present

## 2023-02-18 DIAGNOSIS — Z419 Encounter for procedure for purposes other than remedying health state, unspecified: Secondary | ICD-10-CM | POA: Diagnosis not present

## 2023-02-28 DIAGNOSIS — F321 Major depressive disorder, single episode, moderate: Secondary | ICD-10-CM | POA: Diagnosis not present

## 2023-03-07 DIAGNOSIS — F321 Major depressive disorder, single episode, moderate: Secondary | ICD-10-CM | POA: Diagnosis not present

## 2023-03-07 DIAGNOSIS — N644 Mastodynia: Secondary | ICD-10-CM | POA: Diagnosis not present

## 2023-03-09 ENCOUNTER — Encounter: Payer: Self-pay | Admitting: Obstetrics and Gynecology

## 2023-03-09 ENCOUNTER — Other Ambulatory Visit: Payer: Self-pay | Admitting: Obstetrics and Gynecology

## 2023-03-09 DIAGNOSIS — N644 Mastodynia: Secondary | ICD-10-CM

## 2023-03-21 DIAGNOSIS — F321 Major depressive disorder, single episode, moderate: Secondary | ICD-10-CM | POA: Diagnosis not present

## 2023-03-21 DIAGNOSIS — Z419 Encounter for procedure for purposes other than remedying health state, unspecified: Secondary | ICD-10-CM | POA: Diagnosis not present

## 2023-03-25 ENCOUNTER — Ambulatory Visit
Admission: RE | Admit: 2023-03-25 | Discharge: 2023-03-25 | Disposition: A | Discharge status: Home / Self Care | Payer: Medicaid Other | Source: Ambulatory Visit | Attending: Obstetrics and Gynecology | Admitting: Obstetrics and Gynecology

## 2023-03-25 ENCOUNTER — Other Ambulatory Visit: Payer: Self-pay | Admitting: Obstetrics and Gynecology

## 2023-03-25 DIAGNOSIS — N644 Mastodynia: Secondary | ICD-10-CM

## 2023-03-25 DIAGNOSIS — N6310 Unspecified lump in the right breast, unspecified quadrant: Secondary | ICD-10-CM

## 2023-03-28 ENCOUNTER — Ambulatory Visit
Admission: RE | Admit: 2023-03-28 | Discharge: 2023-03-28 | Disposition: A | Discharge status: Home / Self Care | Payer: Medicaid Other | Source: Ambulatory Visit | Attending: Obstetrics and Gynecology | Admitting: Obstetrics and Gynecology

## 2023-03-28 DIAGNOSIS — N6310 Unspecified lump in the right breast, unspecified quadrant: Secondary | ICD-10-CM

## 2023-03-28 HISTORY — PX: BREAST BIOPSY: SHX20

## 2023-04-04 DIAGNOSIS — N6021 Fibroadenosis of right breast: Secondary | ICD-10-CM | POA: Insufficient documentation

## 2023-04-04 DIAGNOSIS — F418 Other specified anxiety disorders: Secondary | ICD-10-CM | POA: Insufficient documentation

## 2023-04-04 DIAGNOSIS — F419 Anxiety disorder, unspecified: Secondary | ICD-10-CM | POA: Diagnosis not present

## 2023-04-13 ENCOUNTER — Other Ambulatory Visit: Payer: Self-pay

## 2023-04-13 ENCOUNTER — Telehealth: Payer: Self-pay | Admitting: Gastroenterology

## 2023-04-13 MED ORDER — FAMOTIDINE 20 MG PO TABS: 20.0000 mg | ORAL_TABLET | Freq: Every day | ORAL | 3 refills | Status: DC

## 2023-04-13 NOTE — Telephone Encounter (Signed)
Medication sent to patient's pharmacy.

## 2023-04-13 NOTE — Telephone Encounter (Signed)
Inbound call from patient, stated that she was told by Doug Sou that she will be able to sent a prescription for "famotidine" , she is running out and need a new prescription.Please advise

## 2023-04-18 DIAGNOSIS — F321 Major depressive disorder, single episode, moderate: Secondary | ICD-10-CM | POA: Diagnosis not present

## 2023-04-20 DIAGNOSIS — Z419 Encounter for procedure for purposes other than remedying health state, unspecified: Secondary | ICD-10-CM | POA: Diagnosis not present

## 2023-04-27 ENCOUNTER — Other Ambulatory Visit: Payer: Medicaid Other

## 2023-05-02 DIAGNOSIS — F321 Major depressive disorder, single episode, moderate: Secondary | ICD-10-CM | POA: Diagnosis not present

## 2023-05-10 DIAGNOSIS — L300 Nummular dermatitis: Secondary | ICD-10-CM | POA: Diagnosis not present

## 2023-05-10 DIAGNOSIS — D485 Neoplasm of uncertain behavior of skin: Secondary | ICD-10-CM | POA: Diagnosis not present

## 2023-05-10 DIAGNOSIS — L811 Chloasma: Secondary | ICD-10-CM | POA: Diagnosis not present

## 2023-05-15 ENCOUNTER — Encounter (HOSPITAL_COMMUNITY): Payer: Self-pay | Admitting: Emergency Medicine

## 2023-05-15 ENCOUNTER — Ambulatory Visit (HOSPITAL_COMMUNITY)
Admission: EM | Admit: 2023-05-15 | Discharge: 2023-05-15 | Disposition: A | Discharge status: Home / Self Care | Payer: Medicaid Other

## 2023-05-15 ENCOUNTER — Other Ambulatory Visit: Payer: Self-pay

## 2023-05-15 DIAGNOSIS — R21 Rash and other nonspecific skin eruption: Secondary | ICD-10-CM

## 2023-05-15 NOTE — ED Provider Notes (Signed)
[facility]    CSN: [id number] Arrival date & time: 05/15/23  1416      History   Chief Complaint Chief Complaint  Patient presents with   Rash    Pt states she is been using Tretinoin Cream prescribe by her Demonologist for 2 days now she has a rash on her face that is burning.    HPI Becky Scott is a 32 y.o. female.   Patient started to use tretinoin cream prescribed by dermatology to help treat her sun spots. She used it two days in a row and since then has had a painful, red rash to her cheeks. She is unable to tolerate air blowing on the rash as she moves throughout the house. Is breastfeeding, has been taking Tylenol for pain and applying cool rags to her face.   The history is provided by the patient and medical records.  Rash   Past Medical History:  Diagnosis Date   Allergy    Asthma    seasonal    GERD with esophagitis    Trichomonas     Patient Active Problem List   Diagnosis Date Noted   Gastroesophageal reflux disease 09/29/2022   History of migraine 09/23/2022   GERD with esophagitis 09/23/2022   Bilateral plantar fasciitis 09/23/2022   SVD (spontaneous vaginal delivery) 05/07/2021   Term pregnancy 05/06/2021   Severe obesity (BMI >= 40) (HCC) 03/30/2016   Family history of breast cancer in mother 08/19/2015   Family history of thyroid cancer 08/19/2015   Routine general medical examination at a health care facility 05/21/2015   Mild intermittent asthma without complication 05/04/2013    Past Surgical History:  Procedure Laterality Date   BREAST BIOPSY Right 03/28/2023   [place] RT BREAST BX W LOC DEV 1ST LESION IMG BX SPEC US GUIDE 03/28/2023 [facility]   NO PAST SURGERIES      OB History     Gravida  3   Para  2   Term  2   Preterm      AB  1   Living  2      SAB  1   IAB      Ectopic      Multiple  0   Live Births  2            Home Medications    Prior to Admission medications   Medication Sig  Start Date End Date Taking? Authorizing Provider  famotidine (PEPCID) 20 MG tablet Take 1 tablet (20 mg total) by mouth daily. Take daily at bedtime. 04/13/23   Zehr, Princella Pellegrini, PA-C  Multiple Vitamin (MULTIVITAMIN) tablet Take 1 tablet by mouth daily.    [provider]  pantoprazole (PROTONIX) 40 MG tablet Take 1 tablet (40 mg total) by mouth 2 (two) times daily. 01/05/23   Zehr, Princella Pellegrini, PA-C  sertraline (ZOLOFT) 50 MG tablet Take 75 mg by mouth daily.    [provider]  ALBUTEROL IN Inhale into the lungs.  05/02/20  [provider]    Family History Family History  Problem Relation Age of Onset   Breast cancer Mother 11       negative BRCA testing in 2010   Thyroid cancer Mother 62       papillary type   Asthma Mother    High blood pressure Mother    High blood pressure Father    Prostate cancer Father        dx. 37  or younger   Lung cancer Maternal Grandmother 31       was a smoker   High blood pressure Maternal Grandmother    High blood pressure Maternal Grandfather    Cervical cancer Paternal Grandmother    High blood pressure Paternal Grandmother    Prostate cancer Paternal Grandfather        lim info   High blood pressure Paternal Grandfather    Colon polyps Paternal Grandfather    Other Maternal Aunt        potentially also had genetic testing   Breast cancer Other        dx. 83s    Social History Social History   Tobacco Use   Smoking status: Former    Packs/day: 0.50    Years: 2.00    Additional pack years: 0.00    Total pack years: 1.00    Types: Cigarettes   Smokeless tobacco: Never  Vaping Use   Vaping Use: Never used  Substance Use Topics   Alcohol use: Not Currently    Alcohol/week: 0.0 standard drinks of alcohol   Drug use: No     Allergies   Patient has no known allergies.   Review of Systems Review of Systems  Skin:  Positive for rash.     Physical Exam Triage Vital Signs ED Triage Vitals  Enc Vitals  Group     BP 05/15/23 1434 (!) 146/94     Pulse Rate 05/15/23 1434 78     Resp 05/15/23 1434 16     Temp 05/15/23 1434 98.4 F (36.9 C)     Temp Source 05/15/23 1434 Oral     SpO2 05/15/23 1434 97 %     Weight 05/15/23 1435 (!) 313 lb 0.9 oz (142 kg)     Height 05/15/23 1435 5\' 3"  (1.6 m)     Head Circumference --      Peak Flow --      Pain Score 05/15/23 1435 5     Pain Loc --      Pain Edu? --      Excl. in GC? --    No data found.  Updated Vital Signs BP (!) 146/94 (BP Location: Right Arm)   Pulse 78   Temp 98.4 F (36.9 C) (Oral)   Resp 16   Ht 5\' 3"  (1.6 m)   Wt (!) 313 lb 0.9 oz (142 kg)   SpO2 97%   BMI 55.45 kg/m   Visual Acuity Right Eye Distance:   Left Eye Distance:   Bilateral Distance:    Right Eye Near:   Left Eye Near:    Bilateral Near:     Physical Exam Vitals and nursing note reviewed.  Constitutional:      Appearance: Normal appearance.  HENT:     Head: Normocephalic and atraumatic.     Right Ear: External ear normal.     Left Ear: External ear normal.     Nose: Nose normal.     Mouth/Throat:     Mouth: Mucous membranes are moist.  Eyes:     Conjunctiva/sclera: Conjunctivae normal.  Cardiovascular:     Rate and Rhythm: Normal rate.  Pulmonary:     Effort: Pulmonary effort is normal. No respiratory distress.  Skin:    General: Skin is warm and dry.     Findings: Rash present.  Neurological:     General: No focal deficit present.     Mental Status: She is alert and oriented  to person, place, and time.  Psychiatric:        Mood and Affect: Mood normal.        Behavior: Behavior normal.      UC Treatments / Results  Labs (all labs ordered are listed, but only abnormal results are displayed) Labs Reviewed - No data to display  EKG   Radiology No results found.  Procedures Procedures (including critical care time)  Medications Ordered in UC Medications - No data to display  Initial Impression / Assessment and Plan /  UC Course  I have reviewed the triage vital signs and the nursing notes.  Pertinent labs & imaging results that were available during my care of the patient were reviewed by me and considered in my medical decision making (see chart for details).  Vitals and triage reviewed, patient is hemodynamically stable.  Erythematous rash across face, most likely due to tretinoin application 2 days in a row.  Advised to either alternate to every other day or to stop this medication.  Advised sun protection, cool compresses and Tylenol or ibuprofen as needed for pain.  Plan of care, follow-up care and return precautions discussed, no questions at this time.    Final Clinical Impressions(s) / UC Diagnoses   Final diagnoses:  Rash and nonspecific skin eruption     Discharge Instructions      Unfortunately your rash, sensitivity and burning are a common side effect with your tretinoin cream.  Please continue to apply a cool rag to the area, moisturize with unscented moisturizer and refrain from using the cream.  You can take Tylenol as needed for pain.  Ibuprofen is also safe to use during breast-feeding.  I suggest returning to the dermatologist to find other methods to help with your sun spots.  Please wear sunscreen or sun protection.      ED Prescriptions   None    PDMP not reviewed this encounter.   Alisi Lupien, [place], [place] 05/15/23 1453

## 2023-05-15 NOTE — ED Triage Notes (Signed)
Pt states she is been using Tretinoin Cream prescribe by her Demonologist for 2 days now she has a rash on her face that is burning.

## 2023-05-15 NOTE — Discharge Instructions (Signed)
Unfortunately your rash, sensitivity and burning are a common side effect with your tretinoin cream.  Please continue to apply a cool rag to the area, moisturize with unscented moisturizer and refrain from using the cream.  You can take Tylenol as needed for pain.  Ibuprofen is also safe to use during breast-feeding.  I suggest returning to the dermatologist to find other methods to help with your sun spots.  Please wear sunscreen or sun protection.

## 2023-05-21 DIAGNOSIS — Z419 Encounter for procedure for purposes other than remedying health state, unspecified: Secondary | ICD-10-CM | POA: Diagnosis not present

## 2023-06-06 ENCOUNTER — Ambulatory Visit (HOSPITAL_COMMUNITY)
Admission: EM | Admit: 2023-06-06 | Discharge: 2023-06-06 | Disposition: A | Discharge status: Home / Self Care | Payer: Medicaid Other | Attending: Family Medicine | Admitting: Family Medicine

## 2023-06-06 ENCOUNTER — Encounter (HOSPITAL_COMMUNITY): Payer: Self-pay

## 2023-06-06 DIAGNOSIS — M79672 Pain in left foot: Secondary | ICD-10-CM | POA: Diagnosis not present

## 2023-06-06 DIAGNOSIS — S91332A Puncture wound without foreign body, left foot, initial encounter: Secondary | ICD-10-CM | POA: Diagnosis not present

## 2023-06-06 DIAGNOSIS — Z23 Encounter for immunization: Secondary | ICD-10-CM | POA: Diagnosis not present

## 2023-06-06 MED ORDER — TETANUS-DIPHTH-ACELL PERTUSSIS 5-2.5-18.5 LF-MCG/0.5 IM SUSY
0.5000 mL | PREFILLED_SYRINGE | Freq: Once | INTRAMUSCULAR | Status: AC
  Administered 2023-06-06: 0.5 mL via INTRAMUSCULAR

## 2023-06-06 MED ORDER — TETANUS-DIPHTH-ACELL PERTUSSIS 5-2.5-18.5 LF-MCG/0.5 IM SUSY
PREFILLED_SYRINGE | INTRAMUSCULAR | Status: AC
  Filled 2023-06-06: qty 0.5

## 2023-06-06 NOTE — ED Triage Notes (Signed)
Pt states stepped on a nail last night. Puncture mark to bottom lt foot noted. States needs a tetanus shot.

## 2023-06-08 NOTE — ED Provider Notes (Signed)
[facility]   [id number] 06/06/23 Arrival Time: 7829  ASSESSMENT & PLAN:  1. Left foot pain   2. Puncture wound of left foot, initial encounter    No imaging indicated.  Given: Meds ordered this encounter  Medications   Tdap (BOOSTRIX) injection 0.5 mL   Watch for infection. Activities as tolerated.  Recommend:  Follow-up Information     [facility].   Specialty: Urgent Care Why: As needed. Contact information: [address] [place] [address] [phone]               Reviewed expectations re: course of current medical issues. Questions answered. Outlined signs and symptoms indicating need for more acute intervention. Patient verbalized understanding. After Visit Summary given.  SUBJECTIVE: History from: patient. Becky Scott is a 32 y.o. female who reports stepping on a nail last night. Puncture mark to bottom lt foot noted. States needs a tetanus shot.  Minimal  pain. No bleeding.  Past Surgical History:  Procedure Laterality Date   BREAST BIOPSY Right 03/28/2023   [place] RT BREAST BX W LOC DEV 1ST LESION IMG BX SPEC US GUIDE 03/28/2023 [facility]MOGRAP[facility]   NO PAST SURGERIES        OBJECTIVE:  Vitals:   06/06/23 0947  BP: 134/83  Pulse: 67  Resp: 18  Temp: 98.5 F (36.9 C)  TempSrc: Oral  SpO2: 97%    General appearance: alert; no distress HEENT: [place]; AT Neck: supple with FROM Resp: unlabored respirations Extremities: LLE: pinpoint puncture wound to inferior distal plantar left foot Skin: warm and dry; no visible rashes Neurologic: gait normal Psychological: alert and cooperative; normal mood and affect     No Known Allergies  Past Medical History:  Diagnosis Date   Allergy    Asthma    seasonal    GERD with esophagitis    Trichomonas    Social History   Socioeconomic History   Marital status: Single    Spouse name: Not on file   Number of children: 2   Years of  education: 12   Highest education level: Not on file  Occupational History   Occupation: Scientific laboratory technician: [employer]  Tobacco Use   Smoking status: Former    Packs/day: 0.50    Years: 2.00    Additional pack years: 0.00    Total pack years: 1.00    Types: Cigarettes   Smokeless tobacco: Never  Vaping Use   Vaping Use: Never used  Substance and Sexual Activity   Alcohol use: Not Currently    Alcohol/week: 0.0 standard drinks of alcohol   Drug use: No   Sexual activity: Yes    Birth control/protection: None  Other Topics Concern   Not on file  Social History Narrative   Fun: Entertains her son.    Denies religious beliefs effecting health care.    Social Determinants of Health   Financial Resource Strain: Not on file  Food Insecurity: Not on file  Transportation Needs: Not on file  Physical Activity: Not on file  Stress: Not on file  Social Connections: Not on file   Family History  Problem Relation Age of Onset   Breast cancer Mother 70       negative BRCA testing in 2010   Thyroid cancer Mother 56       papillary type   Asthma Mother    High blood pressure Mother    High blood  pressure Father    Prostate cancer Father        dx. 94 or younger   Lung cancer Maternal Grandmother 47       was a smoker   High blood pressure Maternal Grandmother    High blood pressure Maternal Grandfather    Cervical cancer Paternal Grandmother    High blood pressure Paternal Grandmother    Prostate cancer Paternal Grandfather        lim info   High blood pressure Paternal Grandfather    Colon polyps Paternal Grandfather    Other Maternal Aunt        potentially also had genetic testing   Breast cancer Other        dx. 93s   Past Surgical History:  Procedure Laterality Date   BREAST BIOPSY Right 03/28/2023   [place] RT BREAST BX W LOC DEV 1ST LESION IMG BX SPEC US GUIDE 03/28/2023 [facility]   NO PAST SURGERIES         Mardella Layman, MD 06/08/23  [phone]

## 2023-06-20 DIAGNOSIS — Z419 Encounter for procedure for purposes other than remedying health state, unspecified: Secondary | ICD-10-CM | POA: Diagnosis not present

## 2023-06-27 DIAGNOSIS — F411 Generalized anxiety disorder: Secondary | ICD-10-CM | POA: Diagnosis not present

## 2023-06-28 ENCOUNTER — Encounter: Payer: Self-pay | Admitting: Nurse Practitioner

## 2023-06-28 ENCOUNTER — Ambulatory Visit (INDEPENDENT_AMBULATORY_CARE_PROVIDER_SITE_OTHER): Payer: Medicaid Other | Admitting: Nurse Practitioner

## 2023-06-28 VITALS — BP 124/78 | HR 67 | Ht 65.0 in | Wt 311.8 lb

## 2023-06-28 DIAGNOSIS — R002 Palpitations: Secondary | ICD-10-CM

## 2023-06-28 DIAGNOSIS — Z6841 Body Mass Index (BMI) 40.0 and over, adult: Secondary | ICD-10-CM | POA: Diagnosis not present

## 2023-06-28 DIAGNOSIS — R4586 Emotional lability: Secondary | ICD-10-CM

## 2023-06-28 DIAGNOSIS — Z0000 Encounter for general adult medical examination without abnormal findings: Secondary | ICD-10-CM

## 2023-06-28 DIAGNOSIS — M79672 Pain in left foot: Secondary | ICD-10-CM

## 2023-06-28 DIAGNOSIS — N926 Irregular menstruation, unspecified: Secondary | ICD-10-CM

## 2023-06-28 DIAGNOSIS — K2100 Gastro-esophageal reflux disease with esophagitis, without bleeding: Secondary | ICD-10-CM

## 2023-06-28 DIAGNOSIS — F418 Other specified anxiety disorders: Secondary | ICD-10-CM | POA: Diagnosis not present

## 2023-06-28 DIAGNOSIS — M79671 Pain in right foot: Secondary | ICD-10-CM

## 2023-06-28 DIAGNOSIS — M5416 Radiculopathy, lumbar region: Secondary | ICD-10-CM

## 2023-06-28 LAB — CBC WITH DIFFERENTIAL/PLATELET
Basophils Absolute: 0 10*3/uL (ref 0.0–0.2)
Basos: 1 %
EOS (ABSOLUTE): 0.1 10*3/uL (ref 0.0–0.4)
Eos: 1 %
Hematocrit: 39.1 % (ref 34.0–46.6)
Hemoglobin: 12.2 g/dL (ref 11.1–15.9)
Lymphocytes Absolute: 2.2 10*3/uL (ref 0.7–3.1)
Lymphs: 28 %
MCV: 83 fL (ref 79–97)
Monocytes Absolute: 0.4 10*3/uL (ref 0.1–0.9)
Neutrophils Absolute: 5.2 10*3/uL (ref 1.4–7.0)
Neutrophils: 63 %
Platelets: 295 10*3/uL (ref 150–450)
RBC: 4.72 x10E6/uL (ref 3.77–5.28)
RDW: 13.6 % (ref 11.7–15.4)
WBC: 8.1 10*3/uL (ref 3.4–10.8)

## 2023-06-28 LAB — LIPID PANEL

## 2023-06-28 LAB — CMP14+EGFR

## 2023-06-28 LAB — VITAMIN D 25 HYDROXY (VIT D DEFICIENCY, FRACTURES)

## 2023-06-28 LAB — VITAMIN B12

## 2023-06-28 LAB — TSH

## 2023-06-28 LAB — HEMOGLOBIN A1C: Est. average glucose Bld gHb Est-mCnc: 108 mg/dL

## 2023-06-28 LAB — T4, FREE

## 2023-06-28 LAB — HEPATITIS C ANTIBODY

## 2023-06-28 MED ORDER — BUSPIRONE HCL 10 MG PO TABS
10.0000 mg | ORAL_TABLET | Freq: Three times a day (TID) | ORAL | 3 refills | Status: DC | PRN
Start: 2023-06-28 — End: 2023-07-20

## 2023-06-28 MED ORDER — FAMOTIDINE 20 MG PO TABS: 20.0000 mg | ORAL_TABLET | Freq: Every day | ORAL | 3 refills | Status: DC

## 2023-06-28 NOTE — Assessment & Plan Note (Signed)
Bilateral foot pain consistent with plantar fasciitis. We discussed exercises and techniques that may help, however, she has tried all of the suggestions with no relief. She is unable to stand or walk for more than a short while due to the pain in her feet.  Plan: - Referral to podiatry sent for further evaluation and recommendations.

## 2023-06-28 NOTE — Assessment & Plan Note (Signed)
Menstrual abnormalities with irregular cycle since restarting menses after childbirth. Cycles, flow, and length change monthly. Plan: - Possibly due to hormonal changes with ongoing breast feeding, we will monitor.  - Labs today to rule out other causes.

## 2023-06-28 NOTE — Assessment & Plan Note (Signed)
Intermittent palpitations lasting only a short period with no symptoms today. She does not feel these are related to anxiety. We discussed possible causes.  Plan: - Labs pending - If symptoms worsen, consider a monitor and referral to cardiology.

## 2023-06-28 NOTE — Assessment & Plan Note (Signed)
Left sided lumbosacral pain with radiation into the buttocks and down into the groin and thigh of the left side. No weakness or limitations to ROM today. No saddle symptoms.  Plan: - Recommend PT to see if this is helpful for management.  - OK to take ibuprofen, but avoid taking high doses every day.

## 2023-06-28 NOTE — Assessment & Plan Note (Signed)
See depression and anxiety

## 2023-06-28 NOTE — Patient Instructions (Signed)
WEIGHT LOSS PLANNING  For best management of weight, it is vital to balance intake versus output. This means the number of calories burned per day must be less than the calories you take in with food and drink.   I recommend trying to follow a diet with the following: Calories: 1200-1500 calories per day Carbohydrates: 150-180 grams of carbohydrates per day  Why: Gives your body enough "quick fuel" for cells to maintain normal function without sending them into starvation mode.  Protein: At least 90 grams of protein per day- 30 grams with each meal Why: Protein takes longer and uses more energy than carbohydrates to break down for fuel. The carbohydrates in your meals serves as quick energy sources and proteins help use some of that extra quick energy to break down to produce long term energy. This helps you not feel hungry as quickly and protein breakdown burns calories.  Water: Drink AT LEAST 64 ounces of water per day  Why: Water is essential to healthy metabolism. Water helps to fill the stomach and keep you fuller longer. Water is required for healthy digestion and filtering of waste in the body.  Fat: Limit fats in your diet- when choosing fats, choose foods with lower fats content such as lean meats (chicken, fish, turkey).  Why: Increased fat intake leads to storage "for later". Once you burn your carbohydrate energy, your body goes into fat and protein breakdown mode to help you loose weight.  Cholesterol: Fats and oils that are LIQUID at room temperature are best. Choose vegetable oils (olive oil, avocado oil, nuts). Avoid fats that are SOLID at room temperature (animal fats, processed meats). Healthy fats are often found in whole grains, beans, nuts, seeds, and berries.  Why: Elevated cholesterol levels lead to build up of cholesterol on the inside of your blood vessels. This will eventually cause the blood vessels to become hard and can lead to high blood pressure and damage to your  organs. When the blood flow is reduced, but the pressure is high from cholesterol buildup, parts of the cholesterol can break off and form clots that can go to the brain or heart leading to a stroke or heart attack.  Fiber: Increase amount of SOLUBLE the fiber in your diet. This helps to fill you up, lowers cholesterol, and helps with digestion. Some foods high in soluble fiber are oats, peas, beans, apples, carrots, barley, and citrus fruits.   Why: Fiber fills you up, helps remove excess cholesterol, and aids in healthy digestion which are all very important in weight management.   I recommend the following as a minimum activity routine: Purposeful walk or other physical activity at least 20 minutes every single day. This means purposefully taking a walk, jog, bike, swim, treadmill, elliptical, dance, etc.  This activity should be ABOVE your normal daily activities, such as walking at work. Goal exercise should be at least 150 minutes a week- work your way up to this.   Heart Rate: Your maximum exercise heart rate should be 220 - Your Age in Years. When exercising, get your heart rate up, but avoid going over the maximum targeted heart rate.  60-70% of your maximum heart rate is where you tend to burn the most fat. To find this number:  220 - Age In Years= Max HR  Max HR x 0.6 (or 0.7) = Fat Burning HR The Fat Burning HR is your goal heart rate while working out to burn the most fat.  NEVER exercise to   the point your feel lightheaded, weak, nauseated, dizzy. If you experience ANY of these symptoms- STOP exercise! Allow yourself to cool down and your heart rate to come down. Then restart slower next time.  If at ANY TIME you feel chest pain or chest pressure during exercise, STOP IMMEDIATELY and seek medical attention.   

## 2023-06-28 NOTE — Progress Notes (Signed)
Becky Clamp, DNP, AGNP-c [facility] [address] [place], [place] [address] Main Office [phone]  BP 124/78   Pulse 67   Ht 5\' 5"  (1.651 m)   Wt (!) 311 lb 12.8 oz (141.4 kg)   LMP 06/25/2023   BMI 51.89 kg/m    Subjective:    Patient ID: Becky Scott, female    DOB: December 20, 1991, 32 y.o.   MRN: [id number]  HPI: Becky Scott is a 32 y.o. female presenting on 06/28/2023 for comprehensive medical examination.   Current medical concerns include: Left sacral pain with radiation down the left leg into the thigh. She reports the symptoms initially started 2 years ago when she was pregnant with her son. Since that time they have been intermittently present, but are occurring more frequently in the last several months. She has difficulty sleeping, walking, and exercising due to the pain at times. She has tried OTC pain management and stretches, but has not had relief. She has no history of injury. She has no saddle symptoms.   [name] is working towards losing weight. She has made significant lifestyle changes to include eating healthier and smaller meals, cutting out soda and other sugary drinks, reducing starches, and increasing her protein intake. She has lost about 10lbs in the last 6 weeks or so.   She is going to therapy for anxiety and depression and taking sertraline   Her menstrual cycles are very irregular and heavy and length vary significantly. Her periods have been back for about 6 months since having her son. Prior to having her son her periods were regular and always about a week.   Pertinent items are noted in HPI.  IMMUNIZATIONS:   Flu: Flu vaccine postponed until flu season Prevnar 13: Prevnar 13 N/A for this patient Prevnar 20: Prevnar 20 N/A for this patient Pneumovax 23: Pneumovax 23 N/A for this patient Vac Shingrix: Shingrix N/A for this patient HPV: HPV N/A for this patient Tetanus: Tetanus completed in the last 10 years COVID: COVID  completed, documentation in chart   HEALTH MAINTENANCE: Pap Smear HM Status: records requested Mammogram HM Status: is not applicable for this patient Colon Cancer Screening HM Status: is not applicable for this patient Bone Density HM Status: is not applicable for this patient STI Testing HM Status: is not applicable for this patient Lung CT HM Status: is not applicable for this patient  She reports regular vision exams q1-5y: Yes  She reports regular dental exams q 56m:  Yes  The patient eats a regular, healthy diet. She endorses exercise and/or activity of: Difficult to get up and move at this time due to foot pain and low back pain.   She currently: She is a single mom with 2 young children  She has some help with her parents.   Most Recent Depression Screen:     06/28/2023    9:30 AM  Depression screen PHQ 2/9  Decreased Interest 0  Down, Depressed, Hopeless 0  PHQ - 2 Score 0   Most Recent Anxiety Screen:      No data to display         Most Recent Fall Screen:    06/28/2023    9:30 AM  Fall Risk   Falls in the past year? 0  Number falls in past yr: 0  Injury with Fall? 0  Risk for fall due to : No Fall Risks  Follow up Falls evaluation completed    Past medical history, surgical history, medications,  allergies, family history and social history reviewed with patient today and changes made to appropriate areas of the chart.  Past Medical History:  Past Medical History:  Diagnosis Date   Allergy    Asthma    seasonal    GERD with esophagitis    Trichomonas    Medications:  Current Outpatient Medications on File Prior to Visit  Medication Sig   pantoprazole (PROTONIX) 40 MG tablet Take 1 tablet (40 mg total) by mouth 2 (two) times daily.   sertraline (ZOLOFT) 50 MG tablet Take 75 mg by mouth daily.   tretinoin (RETIN-A) 0.05 % cream Apply topically at bedtime.   triamcinolone cream (KENALOG) 0.1 % Apply topically.   [DISCONTINUED] ALBUTEROL IN Inhale  into the lungs.   No current facility-administered medications on file prior to visit.   Surgical History:  Past Surgical History:  Procedure Laterality Date   BREAST BIOPSY Right 03/28/2023   [place] RT BREAST BX W LOC DEV 1ST LESION IMG BX SPEC US GUIDE 03/28/2023 [facility] MAMMOGRAPHY   NO PAST SURGERIES     Allergies:  No Known Allergies Family History:  Family History  Problem Relation Age of Onset   Breast cancer Mother 29       negative BRCA testing in 2010   Thyroid cancer Mother 19       papillary type   Asthma Mother    High blood pressure Mother    High blood pressure Father    Prostate cancer Father        dx. 6 or younger   Lung cancer Maternal Grandmother 72       was a smoker   High blood pressure Maternal Grandmother    High blood pressure Maternal Grandfather    Cervical cancer Paternal Grandmother    High blood pressure Paternal Grandmother    Prostate cancer Paternal Grandfather        lim info   High blood pressure Paternal Grandfather    Colon polyps Paternal Grandfather    Other Maternal Aunt        potentially also had genetic testing   Breast cancer Other        dx. 20s       Objective:    BP 124/78   Pulse 67   Ht 5\' 5"  (1.651 m)   Wt (!) 311 lb 12.8 oz (141.4 kg)   LMP 06/25/2023   BMI 51.89 kg/m   Wt Readings from Last 3 Encounters:  06/28/23 (!) 311 lb 12.8 oz (141.4 kg)  05/15/23 (!) 313 lb 0.9 oz (142 kg)  01/05/23 (!) 312 lb 4 oz (141.6 kg)    Physical Exam Vitals and nursing note reviewed.  Constitutional:      Appearance: Normal appearance. She is obese.  HENT:     Head: Normocephalic.     Right Ear: Tympanic membrane normal.     Left Ear: Tympanic membrane normal.     Nose: Nose normal.     Mouth/Throat:     Mouth: Mucous membranes are moist.     Pharynx: Oropharynx is clear.  Eyes:     General: No scleral icterus.    Conjunctiva/sclera: Conjunctivae normal.  Neck:     Vascular: No carotid bruit.  Cardiovascular:      Rate and Rhythm: Normal rate and regular rhythm.     Pulses: Normal pulses.     Heart sounds: Normal heart sounds. No murmur heard. Pulmonary:     Effort: Pulmonary effort is  normal.     Breath sounds: Normal breath sounds.  Abdominal:     General: Bowel sounds are normal. There is no distension.     Palpations: Abdomen is soft.     Tenderness: There is no abdominal tenderness. There is no right CVA tenderness, left CVA tenderness or guarding.  Musculoskeletal:        General: Normal range of motion.     Cervical back: Normal range of motion and neck supple. No tenderness.     Right lower leg: No edema.     Left lower leg: No edema.  Lymphadenopathy:     Cervical: No cervical adenopathy.  Skin:    General: Skin is warm and dry.     Capillary Refill: Capillary refill takes less than 2 seconds.  Neurological:     General: No focal deficit present.     Mental Status: She is alert and oriented to person, place, and time.     Motor: No weakness.     Coordination: Coordination normal.  Psychiatric:        Mood and Affect: Mood normal.     Results for orders placed or performed during the hospital encounter of 05/06/21  CBC  Result Value Ref Range   WBC 14.2 (H) 4.0 - 10.5 K/uL   RBC 4.08 3.87 - 5.11 MIL/uL   Hemoglobin 11.7 (L) 12.0 - 15.0 g/dL   HCT 16.1 (L) 09.6 - 04.5 %   MCV 85.3 80.0 - 100.0 fL   MCH 28.7 26.0 - 34.0 pg   MCHC 33.6 30.0 - 36.0 g/dL   RDW 40.9 81.1 - 91.4 %   Platelets 256 150 - 400 K/uL   nRBC 0.0 0.0 - 0.2 %  RPR  Result Value Ref Range   RPR Ser Ql NON REACTIVE NON REACTIVE  CBC  Result Value Ref Range   WBC 12.5 (H) 4.0 - 10.5 K/uL   RBC 3.30 (L) 3.87 - 5.11 MIL/uL   Hemoglobin 9.3 (L) 12.0 - 15.0 g/dL   HCT 78.2 (L) 95.6 - 21.3 %   MCV 87.9 80.0 - 100.0 fL   MCH 28.2 26.0 - 34.0 pg   MCHC 32.1 30.0 - 36.0 g/dL   RDW 08.6 57.8 - 46.9 %   Platelets 189 150 - 400 K/uL   nRBC 0.0 0.0 - 0.2 %  Type and screen [facility]   Result Value Ref Range   ABO/RH(D) O POS    Antibody Screen NEG    Sample Expiration      05/09/2021,2359 Performed at [facility], [address], [place], [place] [address]          Assessment & Plan:   Problem List Items Addressed This Visit     Routine physical examination - Primary    CPE completed today.   Labs ordered. Will make changes as necessary based on results.  Review of HM activities and recommendations discussed and provided on AVS. Anticipatory guidance, diet, and exercise recommendations provided.  Medications, allergies, and hx reviewed and updated as necessary.  Plan to f/u with CPE in 1 year or sooner for acute/chronic health needs as directed.        Relevant Orders   CBC with Differential/Platelet   CMP14+EGFR   Lipid panel   TSH   Hemoglobin A1c   Hepatitis C antibody   HIV Antibody (routine testing w rflx)   VITAMIN D 25 Hydroxy (Vit-D Deficiency, Fractures)   Vitamin B12  BMI 50.0-59.9, adult (HCC)    BMI 51.89 today. Burnie is actively working on lifestyle changes to improve her health. We discussed focusing on how she feels as opposed to the scale. We also talked about the importance of exercise, although this is not possible at this time due to pain limitations.  Plan: - Continue with lifestyle changes  - Consider exercise of the upper body with cans of veggies - Labs pending today to evaluate any underlying concerns contributing to weight gain      Relevant Orders   CBC with Differential/Platelet   CMP14+EGFR   Lipid panel   TSH   Hemoglobin A1c   Hepatitis C antibody   HIV Antibody (routine testing w rflx)   VITAMIN D 25 Hydroxy (Vit-D Deficiency, Fractures)   Vitamin B12   T4, free   GERD with esophagitis    Brittnie is doing very well on pantoprazole. She is currently in the process of weaning off of the medication with the help of GI. She has no concerns at this time.  Plan: - Continue to work with GI and wean off of  medication as tolerated.       Relevant Medications   famotidine (PEPCID) 20 MG tablet   Mixed anxiety and depressive disorder    Jakyrah is managing her anxiety and depressive symptoms well at this time. She is currently in therapy, which has helped her to develop healthy coping skills. She admits to occasionally having moments of anxiety and frustration. We discussed treatment options that may help.  Plan: - Continue with therapy - Continue on sertraline - Add buspar as needed up to three times a day for increased anxiety, you may change this to scheduled three times a day, if you find that you are taking this daily  - Practice deep breathing and relaxation techniques.       Relevant Medications   busPIRone (BUSPAR) 10 MG tablet   Other Relevant Orders   CBC with Differential/Platelet   CMP14+EGFR   Lipid panel   TSH   Hemoglobin A1c   Hepatitis C antibody   HIV Antibody (routine testing w rflx)   VITAMIN D 25 Hydroxy (Vit-D Deficiency, Fractures)   Vitamin B12   T4, free   Chronic left-sided lumbar radiculopathy    Left sided lumbosacral pain with radiation into the buttocks and down into the groin and thigh of the left side. No weakness or limitations to ROM today. No saddle symptoms.  Plan: - Recommend PT to see if this is helpful for management.  - OK to take ibuprofen, but avoid taking high doses every day.       Relevant Medications   busPIRone (BUSPAR) 10 MG tablet   Other Relevant Orders   CBC with Differential/Platelet   CMP14+EGFR   Lipid panel   TSH   Hemoglobin A1c   Hepatitis C antibody   HIV Antibody (routine testing w rflx)   VITAMIN D 25 Hydroxy (Vit-D Deficiency, Fractures)   Vitamin B12   Ambulatory referral to Physical Therapy   Foot pain, bilateral    Bilateral foot pain consistent with plantar fasciitis. We discussed exercises and techniques that may help, however, she has tried all of the suggestions with no relief. She is unable to stand or walk  for more than a short while due to the pain in her feet.  Plan: - Referral to podiatry sent for further evaluation and recommendations.       Relevant Orders   CBC with  Differential/Platelet   CMP14+EGFR   Lipid panel   TSH   Hemoglobin A1c   Hepatitis C antibody   HIV Antibody (routine testing w rflx)   VITAMIN D 25 Hydroxy (Vit-D Deficiency, Fractures)   Vitamin B12   Ambulatory referral to Physical Therapy   Ambulatory referral to Podiatry   Irregular menses    Menstrual abnormalities with irregular cycle since restarting menses after childbirth. Cycles, flow, and length change monthly. Plan: - Possibly due to hormonal changes with ongoing breast feeding, we will monitor.  - Labs today to rule out other causes.       Relevant Orders   CBC with Differential/Platelet   CMP14+EGFR   Lipid panel   TSH   Hemoglobin A1c   Hepatitis C antibody   HIV Antibody (routine testing w rflx)   VITAMIN D 25 Hydroxy (Vit-D Deficiency, Fractures)   Vitamin B12   T4, free   Mood swings    See depression and anxiety       Relevant Medications   busPIRone (BUSPAR) 10 MG tablet   Other Relevant Orders   CBC with Differential/Platelet   CMP14+EGFR   Lipid panel   TSH   Hemoglobin A1c   Hepatitis C antibody   HIV Antibody (routine testing w rflx)   VITAMIN D 25 Hydroxy (Vit-D Deficiency, Fractures)   Vitamin B12   T4, free   Mother currently breast-feeding   Relevant Orders   CBC with Differential/Platelet   CMP14+EGFR   Lipid panel   TSH   Hemoglobin A1c   Hepatitis C antibody   HIV Antibody (routine testing w rflx)   VITAMIN D 25 Hydroxy (Vit-D Deficiency, Fractures)   Vitamin B12   Palpitations    Intermittent palpitations lasting only a short period with no symptoms today. She does not feel these are related to anxiety. We discussed possible causes.  Plan: - Labs pending - If symptoms worsen, consider a monitor and referral to cardiology.       Relevant Medications    busPIRone (BUSPAR) 10 MG tablet   Other Relevant Orders   CBC with Differential/Platelet   CMP14+EGFR   Lipid panel   TSH   Hemoglobin A1c   Hepatitis C antibody   HIV Antibody (routine testing w rflx)   VITAMIN D 25 Hydroxy (Vit-D Deficiency, Fractures)   Vitamin B12   T4, free       Follow up plan: Return in about 1 year (around 06/27/2024) for CPE.  NEXT PREVENTATIVE PHYSICAL DUE IN 1 YEAR.  PATIENT COUNSELING PROVIDED FOR ALL ADULT PATIENTS: A well balanced diet low in saturated fats, cholesterol, and moderation in carbohydrates.  This can be as simple as monitoring portion sizes and cutting back on sugary beverages such as soda and juice to start with.    Daily water consumption of at least 64 ounces.  Physical activity at least 180 minutes per week.  If just starting out, start 10 minutes a day and work your way up.   This can be as simple as taking the stairs instead of the elevator and walking 2-3 laps around the office  purposefully every day.   STD protection, partner selection, and regular testing if high risk.  Limited consumption of alcoholic beverages if alcohol is consumed. For men, I recommend no more than 14 alcoholic beverages per week, spread out throughout the week (max 2 per day). Avoid "binge" drinking or consuming large quantities of alcohol in one setting.  Please let me know if  you feel you may need help with reduction or quitting alcohol consumption.   Avoidance of nicotine, if used. Please let me know if you feel you may need help with reduction or quitting nicotine use.   Daily mental health attention. This can be in the form of 5 minute daily meditation, prayer, journaling, yoga, reflection, etc.  Purposeful attention to your emotions and mental state can significantly improve your overall wellbeing  and  Health.  Please know that I am here to help you with all of your health care goals and am happy to work with you to find a solution that  works best for you.  The greatest advice I have received with any changes in life are to take it one step at a time, that even means if all you can focus on is the next 60 seconds, then do that and celebrate your victories.  With any changes in life, you will have set backs, and that is OK. The important thing to remember is, if you have a set back, it is not a failure, it is an opportunity to try again! Screening Testing Mammogram Every 1 -2 years based on history and risk factors Starting at age 11 Pap Smear Ages 21-39 every 3 years Ages 33-65 every 5 years with HPV testing More frequent testing may be required based on results and history Colon Cancer Screening Every 1-10 years based on test performed, risk factors, and history Starting at age 12 Bone Density Screening Every 2-10 years based on history Starting at age 79 for women Recommendations for men differ based on medication usage, history, and risk factors AAA Screening One time ultrasound Men 29-9 years old who have every smoked Lung Cancer Screening Low Dose Lung CT every 12 months Age 1-80 years with a 30 pack-year smoking history who still smoke or who have quit within the last 15 years   Screening Labs Routine  Labs: Complete Blood Count (CBC), Complete Metabolic Panel (CMP), Cholesterol (Lipid Panel) Every 6-12 months based on history and medications May be recommended more frequently based on current conditions or previous results Hemoglobin A1c Lab Every 3-12 months based on history and previous results Starting at age 72 or earlier with diagnosis of diabetes, high cholesterol, BMI >26, and/or risk factors Frequent monitoring for patients with diabetes to ensure blood sugar control Thyroid Panel (TSH) Every 6 months based on history, symptoms, and risk factors May be repeated more often if on medication HIV One time testing for all patients 52 and older May be repeated more frequently for patients with  increased risk factors or exposure Hepatitis C One time testing for all patients 33 and older May be repeated more frequently for patients with increased risk factors or exposure Gonorrhea, Chlamydia Every 12 months for all sexually active persons 13-24 years Additional monitoring may be recommended for those who are considered high risk or who have symptoms Every 12 months for any woman on birth control, regardless of sexual activity PSA Men 25-26 years old with risk factors Additional screening may be recommended from age 54-69 based on risk factors, symptoms, and history  Vaccine Recommendations Tetanus Booster All adults every 10 years Flu Vaccine All patients 6 months and older every year COVID Vaccine All patients 12 years and older Initial dosing with booster May recommend additional booster based on age and health history HPV Vaccine 2 doses all patients age 42-26 Dosing may be considered for patients over 26 Shingles Vaccine (Shingrix) 2 doses all adults  55 years and older Pneumonia (Pneumovax 23) All adults 65 years and older May recommend earlier dosing based on health history One year apart from Prevnar 13 Pneumonia (Prevnar 82) All adults 65 years and older Dosed 1 year after Pneumovax 23 Pneumonia (Prevnar 20) One time alternative to the two dosing of 13 and 23 For all adults with initial dose of 23, 20 is recommended 1 year later For all adults with initial dose of 13, 23 is still recommended as second option 1 year later

## 2023-06-28 NOTE — Assessment & Plan Note (Signed)
CPE completed today.   Labs ordered. Will make changes as necessary based on results.  Review of HM activities and recommendations discussed and provided on AVS Anticipatory guidance, diet, and exercise recommendations provided.  Medications, allergies, and hx reviewed and updated as necessary.  Plan to f/u with CPE in 1 year or sooner for acute/chronic health needs as directed.   

## 2023-06-28 NOTE — Assessment & Plan Note (Signed)
BMI 51.89 today. Candid is actively working on lifestyle changes to improve her health. We discussed focusing on how she feels as opposed to the scale. We also talked about the importance of exercise, although this is not possible at this time due to pain limitations.  Plan: - Continue with lifestyle changes  - Consider exercise of the upper body with cans of veggies - Labs pending today to evaluate any underlying concerns contributing to weight gain

## 2023-06-28 NOTE — Assessment & Plan Note (Signed)
Becky Scott is managing her anxiety and depressive symptoms well at this time. She is currently in therapy, which has helped her to develop healthy coping skills. She admits to occasionally having moments of anxiety and frustration. We discussed treatment options that may help.  Plan: - Continue with therapy - Continue on sertraline - Add buspar as needed up to three times a day for increased anxiety, you may change this to scheduled three times a day, if you find that you are taking this daily  - Practice deep breathing and relaxation techniques.

## 2023-06-28 NOTE — Assessment & Plan Note (Signed)
Becky Scott is doing very well on pantoprazole. She is currently in the process of weaning off of the medication with the help of GI. She has no concerns at this time.  Plan: - Continue to work with GI and wean off of medication as tolerated.

## 2023-06-29 ENCOUNTER — Ambulatory Visit (INDEPENDENT_AMBULATORY_CARE_PROVIDER_SITE_OTHER): Payer: Medicaid Other

## 2023-06-29 ENCOUNTER — Ambulatory Visit (INDEPENDENT_AMBULATORY_CARE_PROVIDER_SITE_OTHER): Payer: Medicaid Other | Admitting: Podiatry

## 2023-06-29 ENCOUNTER — Encounter: Payer: Self-pay | Admitting: Podiatry

## 2023-06-29 DIAGNOSIS — M722 Plantar fascial fibromatosis: Secondary | ICD-10-CM

## 2023-06-29 LAB — LIPID PANEL
Cholesterol, Total: 216 mg/dL — ABNORMAL HIGH (ref 100–199)
LDL Chol Calc (NIH): 137 mg/dL — ABNORMAL HIGH (ref 0–99)
Triglycerides: 62 mg/dL (ref 0–149)

## 2023-06-29 LAB — CMP14+EGFR
ALT: 13 IU/L (ref 0–32)
Albumin: 4.1 g/dL (ref 3.9–4.9)
BUN: 10 mg/dL (ref 6–20)
Potassium: 4.9 mmol/L (ref 3.5–5.2)
eGFR: 120 mL/min/{1.73_m2} (ref >59)

## 2023-06-29 LAB — HEMOGLOBIN A1C: Hgb A1c MFr Bld: 5.4 % (ref 4.8–5.6)

## 2023-06-29 LAB — CBC WITH DIFFERENTIAL/PLATELET
Immature Grans (Abs): 0.1 10*3/uL (ref 0.0–0.1)
Immature Granulocytes: 1 %
MCH: 25.8 pg — ABNORMAL LOW (ref 26.6–33.0)
MCHC: 31.2 g/dL — ABNORMAL LOW (ref 31.5–35.7)
Monocytes: 6 %

## 2023-06-29 LAB — HIV ANTIBODY (ROUTINE TESTING W REFLEX): HIV Screen 4th Generation wRfx: NONREACTIVE

## 2023-06-29 MED ORDER — DEXAMETHASONE SODIUM PHOSPHATE 120 MG/30ML IJ SOLN
4.0000 mg | Freq: Once | INTRAMUSCULAR | Status: AC
Start: 2023-06-29 — End: ?

## 2023-06-29 MED ORDER — TRIAMCINOLONE ACETONIDE 10 MG/ML IJ SUSP
10.0000 mg | Freq: Once | INTRAMUSCULAR | Status: AC
Start: 2023-06-29 — End: ?

## 2023-06-29 NOTE — Patient Instructions (Signed)

## 2023-06-29 NOTE — Progress Notes (Signed)
  Subjective:  Patient ID: Becky Scott, female    DOB: 02/17/1991,   MRN: [id number]  Chief Complaint  Patient presents with   Foot Pain    Plantar heel bilateral - aching x several months, AM pain, tried stretching, icing, OTC NSAIDS, now can't even be on them long without pain   New Patient (Initial Visit)    32 y.o. female presents for concern as above . Denies any other pedal complaints. Denies n/v/f/c.   Past Medical History:  Diagnosis Date   Allergy    Asthma    seasonal    GERD with esophagitis    Trichomonas     Objective:  Physical Exam: Vascular: DP/PT pulses 2/4 bilateral. CFT <3 seconds. Normal hair growth on digits. No edema.  Skin. No lacerations or abrasions bilateral feet.  Musculoskeletal: MMT 5/5 bilateral lower extremities in DF, PF, Inversion and Eversion. Deceased ROM in DF of ankle joint. Tender to bilateral medial calcaneal tubercles. No tenderness to achilles PT or arch. No pain with calcaneal squeeze.  Neurological: Sensation intact to light touch.   Assessment:   1. Plantar fasciitis, bilateral      Plan:  Patient was evaluated and treated and all questions answered. Discussed plantar fasciitis with patient.  X-rays reviewed and discussed with patient. No acute fractures or dislocations noted. Mild spurring noted at inferior calcaneus and mild spurring to posterior calcaneus.  Discussed treatment options including, ice, NSAIDS, supportive shoes, bracing, and stretching. Stretching exercises provided to be done on a daily basis.   Will avoid NSAID patient unable to take. Stick with tylenol for now.  Patient requesting injection today. Procedure note below. Bilateral PF braces dispensed.    Follow-up 6 weeks or sooner if any problems arise. In the meantime, encouraged to call the office with any questions, concerns, change in symptoms.   Procedure:  Discussed etiology, pathology, conservative vs. surgical therapies. At this time a plantar  fascial injection was recommended.  The patient agreed and a sterile skin prep was applied.  An injection consisting of  1cc dexamethasone 0.5 cc kenalog and 1cc marcaine mixture was infiltrated at the point of maximal tenderness on the bilateral Heel.  Bandaid applied. The patient tolerated this well and was given instructions for aftercare.     Louann Sjogren, DPM

## 2023-07-05 ENCOUNTER — Encounter: Payer: Self-pay | Admitting: Nurse Practitioner

## 2023-07-14 DIAGNOSIS — F411 Generalized anxiety disorder: Secondary | ICD-10-CM | POA: Diagnosis not present

## 2023-07-20 ENCOUNTER — Other Ambulatory Visit: Payer: Self-pay | Admitting: Nurse Practitioner

## 2023-07-20 DIAGNOSIS — F418 Other specified anxiety disorders: Secondary | ICD-10-CM

## 2023-07-20 DIAGNOSIS — R4586 Emotional lability: Secondary | ICD-10-CM

## 2023-07-20 DIAGNOSIS — R002 Palpitations: Secondary | ICD-10-CM

## 2023-07-21 DIAGNOSIS — Z419 Encounter for procedure for purposes other than remedying health state, unspecified: Secondary | ICD-10-CM | POA: Diagnosis not present

## 2023-07-26 DIAGNOSIS — N939 Abnormal uterine and vaginal bleeding, unspecified: Secondary | ICD-10-CM | POA: Diagnosis not present

## 2023-07-27 ENCOUNTER — Other Ambulatory Visit: Payer: Self-pay | Admitting: Nurse Practitioner

## 2023-07-27 DIAGNOSIS — E559 Vitamin D deficiency, unspecified: Secondary | ICD-10-CM

## 2023-07-27 DIAGNOSIS — R7989 Other specified abnormal findings of blood chemistry: Secondary | ICD-10-CM

## 2023-07-27 DIAGNOSIS — E538 Deficiency of other specified B group vitamins: Secondary | ICD-10-CM

## 2023-07-27 MED ORDER — VITAMIN D (ERGOCALCIFEROL) 1.25 MG (50000 UNIT) PO CAPS
50000.0000 [IU] | ORAL_CAPSULE | ORAL | 3 refills | Status: DC
Start: 2023-07-27 — End: 2024-07-30

## 2023-07-29 ENCOUNTER — Other Ambulatory Visit (INDEPENDENT_AMBULATORY_CARE_PROVIDER_SITE_OTHER): Payer: Medicaid Other

## 2023-07-29 DIAGNOSIS — R7989 Other specified abnormal findings of blood chemistry: Secondary | ICD-10-CM | POA: Diagnosis not present

## 2023-07-29 DIAGNOSIS — E538 Deficiency of other specified B group vitamins: Secondary | ICD-10-CM

## 2023-07-29 MED ORDER — CYANOCOBALAMIN 1000 MCG/ML IJ SOLN
1000.0000 ug | Freq: Once | INTRAMUSCULAR | Status: AC
Start: 2023-07-29 — End: 2023-07-29
  Administered 2023-07-29: 1000 ug via INTRAMUSCULAR

## 2023-08-09 ENCOUNTER — Other Ambulatory Visit: Payer: Self-pay

## 2023-08-09 MED ORDER — IRON (FERROUS SULFATE) 325 (65 FE) MG PO TABS: 325.0000 mg | ORAL_TABLET | ORAL | 1 refills | Status: DC

## 2023-08-10 ENCOUNTER — Ambulatory Visit (INDEPENDENT_AMBULATORY_CARE_PROVIDER_SITE_OTHER): Payer: Medicaid Other | Admitting: Podiatry

## 2023-08-10 ENCOUNTER — Encounter: Payer: Self-pay | Admitting: Podiatry

## 2023-08-10 DIAGNOSIS — M722 Plantar fascial fibromatosis: Secondary | ICD-10-CM | POA: Diagnosis not present

## 2023-08-10 MED ORDER — DEXAMETHASONE SODIUM PHOSPHATE 120 MG/30ML IJ SOLN
4.0000 mg | Freq: Once | INTRAMUSCULAR | Status: AC
Start: 2023-08-10 — End: 2023-08-10
  Administered 2023-08-10: 4 mg via INTRA_ARTICULAR

## 2023-08-10 MED ORDER — TRIAMCINOLONE ACETONIDE 10 MG/ML IJ SUSP
2.5000 mg | Freq: Once | INTRAMUSCULAR | Status: AC
Start: 2023-08-10 — End: 2023-08-10
  Administered 2023-08-10: 2.5 mg via INTRA_ARTICULAR

## 2023-08-10 NOTE — Progress Notes (Signed)
  Subjective:  Patient ID: Becky Scott, female    DOB: 1991-11-06,   MRN: [id number]  No chief complaint on file.   32 y.o. female presents for follow-up of bilateral plantar fasciitis. Relates still having pain and the right is doing worse than the left. She has been stretching and using the braces.  . Denies any other pedal complaints. Denies n/v/f/c.   Past Medical History:  Diagnosis Date   Allergy    Asthma    seasonal    GERD with esophagitis    Trichomonas     Objective:  Physical Exam: Vascular: DP/PT pulses 2/4 bilateral. CFT <3 seconds. Normal hair growth on digits. No edema.  Skin. No lacerations or abrasions bilateral feet.  Musculoskeletal: MMT 5/5 bilateral lower extremities in DF, PF, Inversion and Eversion. Deceased ROM in DF of ankle joint. Tender to bilateral medial calcaneal tubercles. No tenderness to achilles PT or arch. No pain with calcaneal squeeze.  Neurological: Sensation intact to light touch.   Assessment:   1. Plantar fasciitis, bilateral      Plan:  Patient was evaluated and treated and all questions answered. Discussed plantar fasciitis with patient.  X-rays reviewed and discussed with patient. No acute fractures or dislocations noted. Mild spurring noted at inferior calcaneus and mild spurring to posterior calcaneus.  Discussed treatment options including, ice, NSAIDS, supportive shoes, bracing, and stretching.  Continue stretching. Will send referral to PT to aid with this.   Will avoid NSAID patient unable to take. Stick with tylenol for now.  Patient requesting injection today. Procedure note below. Continue with braces.  Follow-up 6 weeks or sooner if any problems arise. In the meantime, encouraged to call the office with any questions, concerns, change in symptoms.   Procedure:  Discussed etiology, pathology, conservative vs. surgical therapies. At this time a plantar fascial injection was recommended.  The patient agreed and a sterile  skin prep was applied.  An injection consisting of  1cc dexamethasone 0.5 cc kenalog and 1cc marcaine mixture was infiltrated at the point of maximal tenderness on the bilateral Heel.  Bandaid applied. The patient tolerated this well and was given instructions for aftercare.     Louann Sjogren, DPM

## 2023-08-16 DIAGNOSIS — F411 Generalized anxiety disorder: Secondary | ICD-10-CM | POA: Diagnosis not present

## 2023-08-21 DIAGNOSIS — Z419 Encounter for procedure for purposes other than remedying health state, unspecified: Secondary | ICD-10-CM | POA: Diagnosis not present

## 2023-08-29 ENCOUNTER — Other Ambulatory Visit: Payer: Medicaid Other

## 2023-09-02 ENCOUNTER — Other Ambulatory Visit (INDEPENDENT_AMBULATORY_CARE_PROVIDER_SITE_OTHER): Payer: Medicaid Other

## 2023-09-02 DIAGNOSIS — E538 Deficiency of other specified B group vitamins: Secondary | ICD-10-CM | POA: Diagnosis not present

## 2023-09-02 MED ORDER — CYANOCOBALAMIN 1000 MCG/ML IJ SOLN
1000.0000 ug | Freq: Once | INTRAMUSCULAR | Status: AC
Start: 2023-09-02 — End: 2023-09-02
  Administered 2023-09-02: 1000 ug via INTRAMUSCULAR

## 2023-09-19 ENCOUNTER — Ambulatory Visit (HOSPITAL_BASED_OUTPATIENT_CLINIC_OR_DEPARTMENT_OTHER): Payer: Medicaid Other | Admitting: Physical Therapy

## 2023-09-20 ENCOUNTER — Ambulatory Visit (HOSPITAL_BASED_OUTPATIENT_CLINIC_OR_DEPARTMENT_OTHER): Payer: Medicaid Other | Admitting: Physical Therapy

## 2023-09-20 DIAGNOSIS — Z419 Encounter for procedure for purposes other than remedying health state, unspecified: Secondary | ICD-10-CM | POA: Diagnosis not present

## 2023-10-05 ENCOUNTER — Other Ambulatory Visit (INDEPENDENT_AMBULATORY_CARE_PROVIDER_SITE_OTHER): Payer: Medicaid Other

## 2023-10-05 ENCOUNTER — Other Ambulatory Visit: Payer: Self-pay

## 2023-10-05 DIAGNOSIS — E538 Deficiency of other specified B group vitamins: Secondary | ICD-10-CM | POA: Diagnosis not present

## 2023-10-05 MED ORDER — "LUER LOCK SAFETY SYRINGES 25G X 5/8"" 3 ML MISC": 1.0000 | 0 refills | Status: DC

## 2023-10-05 MED ORDER — CYANOCOBALAMIN 1000 MCG/ML IJ SOLN: 1000.0000 ug | Freq: Once | INTRAMUSCULAR | 5 refills | Status: DC

## 2023-10-05 MED ORDER — CYANOCOBALAMIN 1000 MCG/ML IJ SOLN
1000.0000 ug | Freq: Once | INTRAMUSCULAR | Status: AC
Start: 2023-10-05 — End: 2023-10-05
  Administered 2023-10-05: 1000 ug via INTRAMUSCULAR

## 2023-10-10 ENCOUNTER — Ambulatory Visit: Payer: Medicaid Other | Admitting: Podiatry

## 2023-10-19 ENCOUNTER — Ambulatory Visit (INDEPENDENT_AMBULATORY_CARE_PROVIDER_SITE_OTHER): Payer: Medicaid Other

## 2023-10-19 ENCOUNTER — Encounter: Payer: Self-pay | Admitting: Podiatry

## 2023-10-19 ENCOUNTER — Ambulatory Visit (INDEPENDENT_AMBULATORY_CARE_PROVIDER_SITE_OTHER): Payer: Medicaid Other | Admitting: Podiatry

## 2023-10-19 DIAGNOSIS — M84375A Stress fracture, left foot, initial encounter for fracture: Secondary | ICD-10-CM

## 2023-10-19 DIAGNOSIS — M722 Plantar fascial fibromatosis: Secondary | ICD-10-CM

## 2023-10-19 NOTE — Progress Notes (Signed)
Subjective:   Patient ID: Becky Scott, female   DOB: 32 y.o.   MRN: [id number]   HPI Patient presents stating that her left foot has really started to hurt her recently she still has problems with the heel but it is more the forefoot and it has been swelling and sore and hard to walk on over the last week   ROS      Objective:  Physical Exam  Neurovascular status intact negative Denna Haggard' sign noted forefoot edema left mostly centered around the left fourth metatarsal shaft and the distal one half of the shaft     Assessment:  Probability for stress fracture of the left fourth metatarsal with pain     Plan:  H&P x-rays taken condition discussed great length and she has a cast cam walker at home that I want her to start wearing and I dispensed a Ace wrap to try to control swelling.  She will not bear weight without some kind of immobilization and I want to see her back 3 weeks  X-rays indicate suspicion of the distal one third of the metatarsal shaft left

## 2023-10-20 ENCOUNTER — Encounter: Payer: Self-pay | Admitting: Nurse Practitioner

## 2023-10-21 DIAGNOSIS — Z419 Encounter for procedure for purposes other than remedying health state, unspecified: Secondary | ICD-10-CM | POA: Diagnosis not present

## 2023-10-25 ENCOUNTER — Ambulatory Visit: Payer: Medicaid Other | Admitting: Podiatry

## 2023-11-01 ENCOUNTER — Other Ambulatory Visit: Payer: Self-pay | Admitting: Nurse Practitioner

## 2023-11-09 ENCOUNTER — Encounter: Payer: Self-pay | Admitting: Podiatry

## 2023-11-09 ENCOUNTER — Ambulatory Visit (INDEPENDENT_AMBULATORY_CARE_PROVIDER_SITE_OTHER): Payer: Medicaid Other | Admitting: Podiatry

## 2023-11-09 DIAGNOSIS — M84375A Stress fracture, left foot, initial encounter for fracture: Secondary | ICD-10-CM | POA: Diagnosis not present

## 2023-11-09 DIAGNOSIS — M722 Plantar fascial fibromatosis: Secondary | ICD-10-CM

## 2023-11-09 NOTE — Progress Notes (Signed)
Subjective:   Patient ID: Becky Scott, female   DOB: 31 y.o.   MRN: [id number]   HPI Patient states her foot is better stating that she does have moderate discomfort in the heels but improved and the left foot does still swell at times   ROS      Objective:  Physical Exam  Neurovascular status intact inflammation pain around the left foot improved with moderate pain in the plantar fascia bilateral     Assessment:  2 separate problems with 1 being fasciitis secondarily inflammation fluid of the dorsal left foot     Plan:  HP reviewed both conditions and went ahead and recommended rigid bottom shoes ice compression elevation and reappoint as needed will probably need periodic injections in the heels

## 2023-11-20 DIAGNOSIS — Z419 Encounter for procedure for purposes other than remedying health state, unspecified: Secondary | ICD-10-CM | POA: Diagnosis not present

## 2023-12-13 ENCOUNTER — Other Ambulatory Visit: Payer: Self-pay | Admitting: Gastroenterology

## 2023-12-21 DIAGNOSIS — Z419 Encounter for procedure for purposes other than remedying health state, unspecified: Secondary | ICD-10-CM | POA: Diagnosis not present

## 2023-12-29 DIAGNOSIS — D225 Melanocytic nevi of trunk: Secondary | ICD-10-CM | POA: Diagnosis not present

## 2023-12-29 DIAGNOSIS — D485 Neoplasm of uncertain behavior of skin: Secondary | ICD-10-CM | POA: Diagnosis not present

## 2023-12-29 DIAGNOSIS — D2239 Melanocytic nevi of other parts of face: Secondary | ICD-10-CM | POA: Diagnosis not present

## 2024-01-04 DIAGNOSIS — D2272 Melanocytic nevi of left lower limb, including hip: Secondary | ICD-10-CM | POA: Diagnosis not present

## 2024-01-17 DIAGNOSIS — D485 Neoplasm of uncertain behavior of skin: Secondary | ICD-10-CM | POA: Diagnosis not present

## 2024-01-21 DIAGNOSIS — Z419 Encounter for procedure for purposes other than remedying health state, unspecified: Secondary | ICD-10-CM | POA: Diagnosis not present

## 2024-01-31 ENCOUNTER — Other Ambulatory Visit: Payer: Self-pay | Admitting: Nurse Practitioner

## 2024-02-02 ENCOUNTER — Encounter: Payer: Self-pay | Admitting: Nurse Practitioner

## 2024-02-02 ENCOUNTER — Ambulatory Visit: Payer: Medicaid Other | Admitting: Nurse Practitioner

## 2024-02-02 VITALS — BP 124/80 | HR 68 | Wt 315.6 lb

## 2024-02-02 DIAGNOSIS — R232 Flushing: Secondary | ICD-10-CM

## 2024-02-02 DIAGNOSIS — F5101 Primary insomnia: Secondary | ICD-10-CM

## 2024-02-02 DIAGNOSIS — M791 Myalgia, unspecified site: Secondary | ICD-10-CM

## 2024-02-02 DIAGNOSIS — N926 Irregular menstruation, unspecified: Secondary | ICD-10-CM | POA: Diagnosis not present

## 2024-02-02 DIAGNOSIS — R4586 Emotional lability: Secondary | ICD-10-CM | POA: Diagnosis not present

## 2024-02-02 DIAGNOSIS — G44209 Tension-type headache, unspecified, not intractable: Secondary | ICD-10-CM

## 2024-02-02 DIAGNOSIS — F418 Other specified anxiety disorders: Secondary | ICD-10-CM

## 2024-02-02 MED ORDER — SERTRALINE HCL 100 MG PO TABS: 100.0000 mg | ORAL_TABLET | Freq: Every day | ORAL | 3 refills | Status: DC

## 2024-02-02 MED ORDER — METHOCARBAMOL 500 MG PO TABS: 500.0000 mg | ORAL_TABLET | Freq: Three times a day (TID) | ORAL | 1 refills | Status: AC | PRN

## 2024-02-02 MED ORDER — TRAZODONE HCL 50 MG PO TABS: 25.0000 mg | ORAL_TABLET | Freq: Every evening | ORAL | 1 refills | Status: DC | PRN

## 2024-02-02 NOTE — Progress Notes (Unsigned)
  Tollie Eth, DNP, AGNP-c [facility] [address] [place], [place] [address] [phone]   ACUTE VISIT- ESTABLISHED PATIENT  There were no vitals taken for this visit.  Subjective:  HPI Becky Scott is a 33 y.o. female presents to day for evaluation of acute concern(s).   .hpisecconsentabridge .apsecabridge .pesec  ROS negative except for what is listed in HPI. History, Medications, Surgery, SDOH, and Family History reviewed and updated as appropriate.  Objective:  Physical Exam      Assessment & Plan:   Problem List Items Addressed This Visit   None     Tollie Eth, DNP, AGNP-c

## 2024-02-02 NOTE — Patient Instructions (Addendum)
Plan:  Up the sertraline to 100mg  a day. If this is not making a significant difference in 3 weeks we can increase to 150mg . Please let me know.   Start trazodone about 30 minutes before bedtime for sleep. Start with 1/2 tab and increase as needed to achieve sleepiness. If this is not working once you have reached the 100mg , please let me know.   You can take the methocarbamol up to every 8 hours as you need it for the tension headaches. This should help to break the cycle and bring you some relief.    Generalized Anxiety Disorder, Adult Generalized anxiety disorder (GAD) is a mental health condition. Unlike normal worries, anxiety related to GAD is not triggered by a specific event. These worries do not fade or get better with time. GAD interferes with relationships, work, and school. GAD symptoms can vary from mild to severe. People with severe GAD can have intense waves of anxiety with physical symptoms that are similar to panic attacks. What are the causes? The exact cause of GAD is not known, but the following are believed to have an impact: Differences in natural brain chemicals. Genes passed down from parents to children. Differences in the way threats are perceived. Development and stress during childhood. Personality. What increases the risk? The following factors may make you more likely to develop this condition: Being female. Having a family history of anxiety disorders. Being very shy. Experiencing very stressful life events, such as the death of a loved one. Having a very stressful family environment. What are the signs or symptoms? People with GAD often worry excessively about many things in their lives, such as their health and family. Symptoms may also include: Mental and emotional symptoms: Worrying excessively about natural disasters. Fear of being late. Difficulty concentrating. Fears that others are judging your performance. Physical  symptoms: Fatigue. Headaches, muscle tension, muscle twitches, trembling, or feeling shaky. Feeling like your heart is pounding or beating very fast. Feeling out of breath or like you cannot take a deep breath. Having trouble falling asleep or staying asleep, or experiencing restlessness. Sweating. Nausea, diarrhea, or irritable bowel syndrome (IBS). Behavioral symptoms: Experiencing erratic moods or irritability. Avoidance of new situations. Avoidance of people. Extreme difficulty making decisions. How is this diagnosed? This condition is diagnosed based on your symptoms and medical history. You will also have a physical exam. Your health care provider may perform tests to rule out other possible causes of your symptoms. To be diagnosed with GAD, a person must have anxiety that: Is out of his or her control. Affects several different aspects of his or her life, such as work and relationships. Causes distress that makes him or her unable to take part in normal activities. Includes at least three symptoms of GAD, such as restlessness, fatigue, trouble concentrating, irritability, muscle tension, or sleep problems. Before your health care provider can confirm a diagnosis of GAD, these symptoms must be present more days than they are not, and they must last for 6 months or longer. How is this treated? This condition may be treated with: Medicine. Antidepressant medicine is usually prescribed for long-term daily control. Anti-anxiety medicines may be added in severe cases, especially when panic attacks occur. Talk therapy (psychotherapy). Certain types of talk therapy can be helpful in treating GAD by providing support, education, and guidance. Options include: Cognitive behavioral therapy (CBT). People learn coping skills and self-calming techniques to ease their physical symptoms. They learn to identify unrealistic thoughts and behaviors and  to replace them with more appropriate thoughts and  behaviors. Acceptance and commitment therapy (ACT). This treatment teaches people how to be mindful as a way to cope with unwanted thoughts and feelings. Biofeedback. This process trains you to manage your body's response (physiological response) through breathing techniques and relaxation methods. You will work with a therapist while machines are used to monitor your physical symptoms. Stress management techniques. These include yoga, meditation, and exercise. A mental health specialist can help determine which treatment is best for you. Some people see improvement with one type of therapy. However, other people require a combination of therapies. Follow these instructions at home: Lifestyle Maintain a consistent routine and schedule. Anticipate stressful situations. Create a plan and allow extra time to work with your plan. Practice stress management or self-calming techniques that you have learned from your therapist or your health care provider. Exercise regularly and spend time outdoors. Eat a healthy diet that includes plenty of vegetables, fruits, whole grains, low-fat dairy products, and lean protein. Do not eat a lot of foods that are high in fat, added sugar, or salt (sodium). Drink plenty of water. Avoid alcohol. Alcohol can increase anxiety. Avoid caffeine and certain over-the-counter cold medicines. These may make you feel worse. Ask your pharmacist which medicines to avoid. General instructions Take over-the-counter and prescription medicines only as told by your health care provider. Understand that you are likely to have setbacks. Accept this and be kind to yourself as you persist to take better care of yourself. Anticipate stressful situations. Create a plan and allow extra time to work with your plan. Recognize and accept your accomplishments, even if you judge them as small. Spend time with people who care about you. Keep all follow-up visits. This is important. Where to  find more information [employer] of Mental Health: http://www.maynard.net/ Substance Abuse and Mental Health Services: SkateOasis.com.pt Contact a health care provider if: Your symptoms do not get better. Your symptoms get worse. You have signs of depression, such as: A persistently sad or irritable mood. Loss of enjoyment in activities that used to bring you joy. Change in weight or eating. Changes in sleeping habits. Get help right away if: You have thoughts about hurting yourself or others. If you ever feel like you may hurt yourself or others, or have thoughts about taking your own life, get help right away. Go to your nearest emergency department or: Call your local emergency services (911 in the [place] Call a suicide crisis helpline, such as the National Suicide Prevention Lifeline at (782)002-1832 or 988 in the [place] This is open 24 hours a day in the [place] If you're a Veteran: Call 988 and press 1. This is open 24 hours a day. Text the [employer] at 815-548-5436. Summary Generalized anxiety disorder (GAD) is a mental health condition that involves worry that is not triggered by a specific event. People with GAD often worry excessively about many things in their lives, such as their health and family. GAD may cause symptoms such as restlessness, trouble concentrating, sleep problems, frequent sweating, nausea, diarrhea, headaches, and trembling or muscle twitching. A mental health specialist can help determine which treatment is best for you. Some people see improvement with one type of therapy. However, other people require a combination of therapies. This information is not intended to replace advice given to you by your health care provider. Make sure you discuss any questions you have with your health care provider. Document Revised: 07/21/2023 Document Reviewed: 03/29/2021 Elsevier  Patient Education  2024 [employer].

## 2024-02-03 DIAGNOSIS — R232 Flushing: Secondary | ICD-10-CM | POA: Insufficient documentation

## 2024-02-03 DIAGNOSIS — F5101 Primary insomnia: Secondary | ICD-10-CM | POA: Insufficient documentation

## 2024-02-03 DIAGNOSIS — G44209 Tension-type headache, unspecified, not intractable: Secondary | ICD-10-CM | POA: Insufficient documentation

## 2024-02-03 DIAGNOSIS — M791 Myalgia, unspecified site: Secondary | ICD-10-CM | POA: Insufficient documentation

## 2024-02-03 LAB — CBC WITH DIFFERENTIAL/PLATELET
Basophils Absolute: 0 10*3/uL (ref 0.0–0.2)
Basos: 0 %
EOS (ABSOLUTE): 0.1 10*3/uL (ref 0.0–0.4)
Eos: 1 %
Hematocrit: 37.8 % (ref 34.0–46.6)
Hemoglobin: 12 g/dL (ref 11.1–15.9)
Immature Grans (Abs): 0 10*3/uL (ref 0.0–0.1)
Immature Granulocytes: 0 %
Lymphocytes Absolute: 2.5 10*3/uL (ref 0.7–3.1)
Lymphs: 26 %
MCH: 26.5 pg — ABNORMAL LOW (ref 26.6–33.0)
MCHC: 31.7 g/dL (ref 31.5–35.7)
MCV: 84 fL (ref 79–97)
Monocytes Absolute: 0.6 10*3/uL (ref 0.1–0.9)
Monocytes: 6 %
Neutrophils Absolute: 6.2 10*3/uL (ref 1.4–7.0)
Neutrophils: 67 %
Platelets: 313 10*3/uL (ref 150–450)
RBC: 4.52 x10E6/uL (ref 3.77–5.28)
RDW: 13.8 % (ref 11.7–15.4)
WBC: 9.4 10*3/uL (ref 3.4–10.8)

## 2024-02-03 LAB — COMPREHENSIVE METABOLIC PANEL
ALT: 15 IU/L (ref 0–32)
AST: 14 IU/L (ref 0–40)
Albumin: 4.1 g/dL (ref 3.9–4.9)
Alkaline Phosphatase: 111 IU/L (ref 44–121)
BUN/Creatinine Ratio: 19 (ref 9–23)
BUN: 14 mg/dL (ref 6–20)
Bilirubin Total: 0.5 mg/dL (ref 0.0–1.2)
CO2: 24 mmol/L (ref 20–29)
Calcium: 8.5 mg/dL — ABNORMAL LOW (ref 8.7–10.2)
Chloride: 104 mmol/L (ref 96–106)
Creatinine, Ser: 0.73 mg/dL (ref 0.57–1.00)
Globulin, Total: 2.8 g/dL (ref 1.5–4.5)
Glucose: 111 mg/dL — ABNORMAL HIGH (ref 70–99)
Potassium: 4 mmol/L (ref 3.5–5.2)
Sodium: 140 mmol/L (ref 134–144)
Total Protein: 6.9 g/dL (ref 6.0–8.5)
eGFR: 112 mL/min/{1.73_m2} (ref >59)

## 2024-02-03 LAB — VITAMIN B12: Vitamin B-12: 451 pg/mL (ref 232–1245)

## 2024-02-03 LAB — T4, FREE: Free T4: 0.99 ng/dL (ref 0.82–1.77)

## 2024-02-03 LAB — TSH: TSH: 3.05 u[IU]/mL (ref 0.450–4.500)

## 2024-02-03 LAB — VITAMIN D 25 HYDROXY (VIT D DEFICIENCY, FRACTURES): Vit D, 25-Hydroxy: 28.9 ng/mL — ABNORMAL LOW (ref 30.0–100.0)

## 2024-02-03 NOTE — Assessment & Plan Note (Signed)
High levels of anxiety, poor sleep, and racing thoughts. Anxiety contributing to headaches and overall stress. Current medication includes sertraline 75 mg, previously effective but now less so. Discussed increasing sertraline and adding trazodone for sleep. Explained trazodone's effectiveness in improving sleep without morning grogginess and its safety during breastfeeding. - Increase sertraline to 100 mg daily. - Consider further increase to 150 mg if no significant improvement in 2-3 weeks. - Start trazodone for sleep, beginning with half a tablet 30 minutes before bedtime, with the option to increase up to two tablets.

## 2024-02-03 NOTE — Assessment & Plan Note (Signed)
Severe headaches for the past month, described as pressure and feeling like her head is going to explode. Likely tension headaches or neuralgia, exacerbated by stress and muscle tension in the neck and shoulders. Discussed that muscle tension can pinch nerves, causing headaches. Explained that methocarbamol (Robaxin) can help relax muscles and prevent headaches. Discussed non-pharmacological options like neck and shoulder stretches and massages. - Prescribe methocarbamol (Robaxin) at bedtime for a few nights. - Recommend neck and shoulder stretches and massages.

## 2024-02-03 NOTE — Assessment & Plan Note (Signed)
Hot flashes for the past month, causing significant discomfort and nausea. Differential diagnosis includes thyroid dysfunction and perimenopausal symptoms, although considered too young for menopause. Discussed that thyroid dysfunction can cause hot flashes, palpitations, increased anxiety, and feelings of panic. - Order thyroid function tests to rule out thyroid dysfunction.

## 2024-02-03 NOTE — Assessment & Plan Note (Signed)
Irregular menstrual cycles, including periods of amenorrhea followed by prolonged bleeding. Symptoms consistent with PCOS, which can cause hormonal imbalances and irregular periods. Discussed that PCOS can lead to increased testosterone levels, affecting ovulation and menstrual cycles. Explained that birth control can help regulate periods and hormonal levels. - Discuss birth control options with OBGYN at upcoming appointment on February 28, 2024. - Consider Nexplanon if she prefers not to use Mirena.

## 2024-02-08 DIAGNOSIS — D485 Neoplasm of uncertain behavior of skin: Secondary | ICD-10-CM | POA: Diagnosis not present

## 2024-02-09 ENCOUNTER — Encounter: Payer: Self-pay | Admitting: Nurse Practitioner

## 2024-02-10 ENCOUNTER — Other Ambulatory Visit: Payer: Self-pay

## 2024-02-10 ENCOUNTER — Other Ambulatory Visit: Payer: Self-pay | Admitting: Nurse Practitioner

## 2024-02-10 MED ORDER — CYANOCOBALAMIN 1000 MCG/ML IJ SOLN: 1000.0000 ug | Freq: Once | INTRAMUSCULAR | 5 refills | Status: DC

## 2024-02-18 DIAGNOSIS — Z419 Encounter for procedure for purposes other than remedying health state, unspecified: Secondary | ICD-10-CM | POA: Diagnosis not present

## 2024-02-24 ENCOUNTER — Other Ambulatory Visit: Payer: Self-pay | Admitting: Nurse Practitioner

## 2024-02-24 DIAGNOSIS — R4586 Emotional lability: Secondary | ICD-10-CM

## 2024-02-24 DIAGNOSIS — F418 Other specified anxiety disorders: Secondary | ICD-10-CM

## 2024-02-24 DIAGNOSIS — F5101 Primary insomnia: Secondary | ICD-10-CM

## 2024-03-08 ENCOUNTER — Encounter: Payer: Self-pay | Admitting: Nurse Practitioner

## 2024-03-08 ENCOUNTER — Encounter: Payer: Medicaid Other | Admitting: Nurse Practitioner

## 2024-03-08 ENCOUNTER — Telehealth (INDEPENDENT_AMBULATORY_CARE_PROVIDER_SITE_OTHER): Admitting: Nurse Practitioner

## 2024-03-08 VITALS — Wt 311.0 lb

## 2024-03-08 DIAGNOSIS — E538 Deficiency of other specified B group vitamins: Secondary | ICD-10-CM | POA: Diagnosis not present

## 2024-03-08 DIAGNOSIS — R4586 Emotional lability: Secondary | ICD-10-CM | POA: Diagnosis not present

## 2024-03-08 DIAGNOSIS — F5101 Primary insomnia: Secondary | ICD-10-CM

## 2024-03-08 DIAGNOSIS — F418 Other specified anxiety disorders: Secondary | ICD-10-CM | POA: Diagnosis not present

## 2024-03-08 MED ORDER — CYANOCOBALAMIN 1000 MCG/ML IJ SOLN: 1000.0000 ug | INTRAMUSCULAR | 3 refills | Status: AC

## 2024-03-08 MED ORDER — SERTRALINE HCL 100 MG PO TABS
150.0000 mg | ORAL_TABLET | Freq: Every day | ORAL | 2 refills | Status: AC
Start: 2024-03-08 — End: ?

## 2024-03-08 MED ORDER — TRAZODONE HCL 50 MG PO TABS: 25.0000 mg | ORAL_TABLET | Freq: Every evening | ORAL | 1 refills | Status: AC | PRN

## 2024-03-08 NOTE — Progress Notes (Signed)
 Virtual Visit Encounter mychart visit.   I connected with  Becky Scott on 03/08/24 at  3:45 PM EDT by secure video and audio telemedicine application. I verified that I am speaking with the correct person using two identifiers.   I introduced myself as a Publishing rights manager with the practice. The limitations of evaluation and management by telemedicine discussed with the patient and the availability of in person appointments. The patient expressed verbal understanding and consent to proceed.  Participating parties in this visit include: Myself and patient  The patient is: Patient Location: Other:  work I am: Provider Location: Office/Clinic Subjective:    CC and HPI: Becky Scott is a 33 y.o. year old female presenting for follow-up for mood and sleep  History of Present Illness Becky Scott is a 33 year old female who presents with depression and anxiety management.  She experiences symptoms of depression, including anhedonia and feelings of hopelessness about half of the time over the past two weeks. She reports persistent fatigue and low energy levels, but she is sleeping better. There are no feelings of failure, guilt, or self-harm. These symptoms are somewhat difficult to manage.  Regarding anxiety, she feels nervous, anxious, or on edge most days and has trouble controlling her worrying about half of the time. She experiences difficulty relaxing and feels restless and fidgety most days. She also feels irritable and annoyed most days but does not fear that something awful might happen.  Her sleep has improved with the use of trazodone, which she has adjusted to one and a half tablets as needed. This adjustment has helped her sleep better, with only occasional trouble falling or staying asleep. No side effects from her current medication regimen. She is currently taking sertraline 75mg  and trazodone75 mg. She has not increased her sertraline dose due to insufficient supply, but does  feel she could go up.  She also uses B12 injections and notes she has only one dose left.  Her symptoms are much improved from our last visit, which is reassuring.   Past medical history, Surgical history, Family history not pertinant except as noted below, Social history, Allergies, and medications have been entered into the medical record, reviewed, and corrections made.   Review of Systems:  All review of systems negative except what is listed in the HPI  Objective:    Alert and oriented x 4 Speaking in clear sentences with no shortness of breath. No distress.  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 Reports improvement in depressive symptoms but still experiences anhedonia, feeling down, and low energy about half the time. Sleep has improved with trazodone. No suicidal ideation is present. Reports feeling nervous, anxious, or on edge, with difficulty relaxing and feeling restless most days. Symptoms are somewhat difficult to manage. Current treatment plan includes sertraline, which may be increased to help alleviate anxiety symptoms. Discussed the possibility of adding bupropion if further symptom management is needed. Discussed the option of adding bupropion to counteract potential sexual side effects of higher sertraline doses, but she is not experiencing these side effects. She will let me know if she begins to have side effects.  - Increase sertraline to 150 mg (one and a half tablets) daily. - Send a 90-day supply of sertraline with refills. - Continue trazodone for sleep as needed. OK to  increase to 100mg  if needed.  - Send a MyChart message in four weeks to assess progress and determine if further adjustments are needed. - Will plan to follow-up in 4 months with virtual visit for further management, if not needed  sooner.        Relevant Medications   sertraline (ZOLOFT) 100 MG tablet   traZODone (DESYREL) 50 MG tablet   B12 deficiency   Requires vitamin B12 injections and reports having only one dose left. Medication may have fallen off the automatic refill system. - Send prescription for vitamin B12      Relevant Medications   cyanocobalamin (VITAMIN B12) 1000 MCG/ML injection   Primary insomnia   Relevant Medications   traZODone (DESYREL) 50 MG tablet   Mood swings   Relevant Medications   sertraline (ZOLOFT) 100 MG tablet    orders and follow up as documented in EMR I discussed the assessment and treatment plan with the patient. The patient was provided an opportunity to ask questions and all were answered. The patient agreed with the plan and demonstrated an understanding of the instructions.   The patient was advised to call back or seek an in-person evaluation if the symptoms worsen or if the condition fails to improve as anticipated.  Follow-Up: 4 weeks with [facility] message then 4 months with Virtual   I provided 18 minutes of non-face-to-face interaction with this non face-to-face encounter including intake, same-day documentation, and chart review.   Tollie Eth, NP , DNP, AGNP-c [facility]

## 2024-03-08 NOTE — Patient Instructions (Signed)
 I have sent in the increased dose of Sertraline and a refill on the Trazodone for you.   You are now going to take 1.5 tablets of the sertraline for a total of 150mg . If you feel like this is just not enough after at least 2 weeks then you may increase to 2 tabs (200mg ) and let me know.   I sent a mychart message that will come in about 4 weeks to just check in to see how you feel. If you need to talk to me before then, please call.  I will plan to schedule a virtual check in with you in about 4 months but, again, we can always meet sooner if you need to. Do not hesitate to reach out.   I am SO GLAD you are feeling a bit better!! I feel like we are on the road to you feeling like you again!!  I sent the B12 in for you.   Have an AMAZING weekend and I hope you were able to celebrate your birthday exactly like you wanted! If not- give yourself a do-over this weekend and live it up! :-)

## 2024-03-08 NOTE — Assessment & Plan Note (Addendum)
 I am thrilled with her response and reduced symptoms since her last visit. Reports improvement in depressive symptoms but still experiences anhedonia, feeling down, and low energy about half the time. Sleep has improved with trazodone. No suicidal ideation is present. Reports feeling nervous, anxious, or on edge, with difficulty relaxing and feeling restless most days. Symptoms are somewhat difficult to manage. Current treatment plan includes sertraline, which may be increased to help alleviate anxiety symptoms. Discussed the possibility of adding bupropion if further symptom management is needed. Discussed the option of adding bupropion to counteract potential sexual side effects of higher sertraline doses, but she is not experiencing these side effects. She will let me know if she begins to have side effects.  - Increase sertraline to 150 mg (one and a half tablets) daily. - Send a 90-day supply of sertraline with refills. - Continue trazodone for sleep as needed. OK to increase to 100mg  if needed.  - Send a MyChart message in four weeks to assess progress and determine if further adjustments are needed. - Will plan to follow-up in 4 months with virtual visit for further management, if not needed sooner.

## 2024-03-08 NOTE — Assessment & Plan Note (Signed)
 Requires vitamin B12 injections and reports having only one dose left. Medication may have fallen off the automatic refill system. - Send prescription for vitamin B12

## 2024-03-13 DIAGNOSIS — Z3202 Encounter for pregnancy test, result negative: Secondary | ICD-10-CM | POA: Diagnosis not present

## 2024-03-13 DIAGNOSIS — Z124 Encounter for screening for malignant neoplasm of cervix: Secondary | ICD-10-CM | POA: Diagnosis not present

## 2024-03-13 DIAGNOSIS — Z130 Encounter for screening for diseases of the blood and blood-forming organs and certain disorders involving the immune mechanism: Secondary | ICD-10-CM | POA: Diagnosis not present

## 2024-03-13 DIAGNOSIS — Z1389 Encounter for screening for other disorder: Secondary | ICD-10-CM | POA: Diagnosis not present

## 2024-03-13 DIAGNOSIS — Z01419 Encounter for gynecological examination (general) (routine) without abnormal findings: Secondary | ICD-10-CM | POA: Diagnosis not present

## 2024-03-13 DIAGNOSIS — Z30430 Encounter for insertion of intrauterine contraceptive device: Secondary | ICD-10-CM | POA: Diagnosis not present

## 2024-03-13 DIAGNOSIS — R87810 Cervical high risk human papillomavirus (HPV) DNA test positive: Secondary | ICD-10-CM | POA: Diagnosis not present

## 2024-03-29 DIAGNOSIS — N879 Dysplasia of cervix uteri, unspecified: Secondary | ICD-10-CM | POA: Diagnosis not present

## 2024-03-29 MED ORDER — BUPROPION HCL ER (XL) 150 MG PO TB24: 150.0000 mg | ORAL_TABLET | Freq: Every morning | ORAL | 3 refills | Status: DC

## 2024-03-31 DIAGNOSIS — Z419 Encounter for procedure for purposes other than remedying health state, unspecified: Secondary | ICD-10-CM | POA: Diagnosis not present

## 2024-04-19 DIAGNOSIS — D485 Neoplasm of uncertain behavior of skin: Secondary | ICD-10-CM | POA: Diagnosis not present

## 2024-04-22 ENCOUNTER — Other Ambulatory Visit: Payer: Self-pay | Admitting: Nurse Practitioner

## 2024-04-22 DIAGNOSIS — F418 Other specified anxiety disorders: Secondary | ICD-10-CM

## 2024-04-24 DIAGNOSIS — Z8742 Personal history of other diseases of the female genital tract: Secondary | ICD-10-CM | POA: Diagnosis not present

## 2024-04-24 DIAGNOSIS — Z09 Encounter for follow-up examination after completed treatment for conditions other than malignant neoplasm: Secondary | ICD-10-CM | POA: Diagnosis not present

## 2024-04-30 DIAGNOSIS — Z419 Encounter for procedure for purposes other than remedying health state, unspecified: Secondary | ICD-10-CM | POA: Diagnosis not present

## 2024-05-31 DIAGNOSIS — Z419 Encounter for procedure for purposes other than remedying health state, unspecified: Secondary | ICD-10-CM | POA: Diagnosis not present

## 2024-06-28 ENCOUNTER — Encounter: Payer: Medicaid Other | Admitting: Nurse Practitioner

## 2024-06-30 DIAGNOSIS — Z419 Encounter for procedure for purposes other than remedying health state, unspecified: Secondary | ICD-10-CM | POA: Diagnosis not present

## 2024-07-11 NOTE — Progress Notes (Deleted)
 Last PAP: HPV: Pneumonia

## 2024-07-13 ENCOUNTER — Encounter: Admitting: Nurse Practitioner

## 2024-07-29 ENCOUNTER — Other Ambulatory Visit: Payer: Self-pay | Admitting: Nurse Practitioner

## 2024-07-29 DIAGNOSIS — E559 Vitamin D deficiency, unspecified: Secondary | ICD-10-CM

## 2024-07-31 ENCOUNTER — Other Ambulatory Visit: Payer: Self-pay | Admitting: Nurse Practitioner

## 2024-07-31 DIAGNOSIS — Z419 Encounter for procedure for purposes other than remedying health state, unspecified: Secondary | ICD-10-CM | POA: Diagnosis not present

## 2024-08-01 ENCOUNTER — Other Ambulatory Visit: Payer: Self-pay | Admitting: Nurse Practitioner

## 2024-08-01 DIAGNOSIS — K2100 Gastro-esophageal reflux disease with esophagitis, without bleeding: Secondary | ICD-10-CM

## 2024-08-22 DIAGNOSIS — L237 Allergic contact dermatitis due to plants, except food: Secondary | ICD-10-CM | POA: Diagnosis not present

## 2024-08-22 DIAGNOSIS — L918 Other hypertrophic disorders of the skin: Secondary | ICD-10-CM | POA: Diagnosis not present

## 2024-08-31 DIAGNOSIS — Z419 Encounter for procedure for purposes other than remedying health state, unspecified: Secondary | ICD-10-CM | POA: Diagnosis not present

## 2024-09-04 DIAGNOSIS — N939 Abnormal uterine and vaginal bleeding, unspecified: Secondary | ICD-10-CM | POA: Diagnosis not present

## 2024-09-30 DIAGNOSIS — Z419 Encounter for procedure for purposes other than remedying health state, unspecified: Secondary | ICD-10-CM | POA: Diagnosis not present

## 2024-11-22 DIAGNOSIS — N898 Other specified noninflammatory disorders of vagina: Secondary | ICD-10-CM | POA: Diagnosis not present

## 2024-11-22 DIAGNOSIS — Z113 Encounter for screening for infections with a predominantly sexual mode of transmission: Secondary | ICD-10-CM | POA: Diagnosis not present

## 2024-11-22 DIAGNOSIS — A5901 Trichomonal vulvovaginitis: Secondary | ICD-10-CM | POA: Diagnosis not present

## 2025-02-25 ENCOUNTER — Encounter: Payer: Self-pay | Admitting: Nurse Practitioner
